# Patient Record
Sex: Female | Born: 1980 | Race: White | Hispanic: No | Marital: Married | Smoking: Never smoker
Health system: Southern US, Community
[De-identification: ages and names within clinical notes are randomized; demographics above are authoritative.]

## PROBLEM LIST (undated history)

## (undated) DIAGNOSIS — J302 Other seasonal allergic rhinitis: Secondary | ICD-10-CM

## (undated) DIAGNOSIS — O24419 Gestational diabetes mellitus in pregnancy, unspecified control: Secondary | ICD-10-CM

## (undated) DIAGNOSIS — K589 Irritable bowel syndrome without diarrhea: Secondary | ICD-10-CM

## (undated) DIAGNOSIS — O139 Gestational [pregnancy-induced] hypertension without significant proteinuria, unspecified trimester: Secondary | ICD-10-CM

## (undated) DIAGNOSIS — F32A Depression, unspecified: Secondary | ICD-10-CM

## (undated) DIAGNOSIS — D259 Leiomyoma of uterus, unspecified: Secondary | ICD-10-CM

## (undated) DIAGNOSIS — F329 Major depressive disorder, single episode, unspecified: Secondary | ICD-10-CM

## (undated) DIAGNOSIS — J189 Pneumonia, unspecified organism: Secondary | ICD-10-CM

## (undated) DIAGNOSIS — F419 Anxiety disorder, unspecified: Secondary | ICD-10-CM

## (undated) DIAGNOSIS — Z8751 Personal history of pre-term labor: Secondary | ICD-10-CM

## (undated) HISTORY — DX: Anxiety disorder, unspecified: F41.9

## (undated) HISTORY — PX: WISDOM TOOTH EXTRACTION: SHX21

---

## 2009-07-20 DIAGNOSIS — E669 Obesity, unspecified: Secondary | ICD-10-CM | POA: Insufficient documentation

## 2009-07-20 DIAGNOSIS — F329 Major depressive disorder, single episode, unspecified: Secondary | ICD-10-CM | POA: Insufficient documentation

## 2012-03-29 ENCOUNTER — Inpatient Hospital Stay: Payer: Self-pay | Admitting: Obstetrics and Gynecology

## 2012-04-02 LAB — PATHOLOGY REPORT

## 2012-07-16 ENCOUNTER — Inpatient Hospital Stay (HOSPITAL_COMMUNITY): Admission: AD | Admit: 2012-07-16 | Payer: Self-pay | Source: Ambulatory Visit | Admitting: Obstetrics and Gynecology

## 2014-10-25 DIAGNOSIS — J189 Pneumonia, unspecified organism: Secondary | ICD-10-CM

## 2014-10-25 HISTORY — DX: Pneumonia, unspecified organism: J18.9

## 2014-11-16 ENCOUNTER — Ambulatory Visit
Admission: RE | Admit: 2014-11-16 | Discharge: 2014-11-16 | Disposition: A | Discharge status: Home / Self Care | Payer: BC Managed Care – PPO | Source: Ambulatory Visit | Attending: Family Medicine | Admitting: Family Medicine

## 2014-11-16 ENCOUNTER — Other Ambulatory Visit: Payer: Self-pay | Admitting: Family Medicine

## 2014-11-16 DIAGNOSIS — R05 Cough: Secondary | ICD-10-CM

## 2014-11-16 DIAGNOSIS — R509 Fever, unspecified: Secondary | ICD-10-CM | POA: Diagnosis present

## 2014-11-16 DIAGNOSIS — R059 Cough, unspecified: Secondary | ICD-10-CM

## 2014-11-16 DIAGNOSIS — J189 Pneumonia, unspecified organism: Secondary | ICD-10-CM | POA: Diagnosis not present

## 2016-02-15 ENCOUNTER — Encounter: Payer: Self-pay | Admitting: Family Medicine

## 2016-02-15 ENCOUNTER — Ambulatory Visit (INDEPENDENT_AMBULATORY_CARE_PROVIDER_SITE_OTHER): Payer: BC Managed Care – PPO | Admitting: Family Medicine

## 2016-02-15 VITALS — BP 122/84 | HR 76 | Temp 98.4°F | Resp 16 | Ht 60.0 in | Wt 231.4 lb

## 2016-02-15 DIAGNOSIS — K589 Irritable bowel syndrome without diarrhea: Secondary | ICD-10-CM

## 2016-02-15 MED ORDER — DICYCLOMINE HCL 20 MG PO TABS: ORAL_TABLET | ORAL | 1 refills | Status: DC

## 2016-02-15 NOTE — Patient Instructions (Signed)
Give the food choices and medication a couple of weeks trial then let me know how you are doing. Do schedule an appointment with [facility] about your skin tag.

## 2016-02-15 NOTE — Progress Notes (Signed)
Subjective:     Patient ID: Amber Whitehead, female   DOB: 1980-07-17, 35 y.o.   MRN: [id number]  HPI  Chief Complaint  Patient presents with  . Skin Problem    Patient comes in office today to address having skin tag removed from right eyelid  . GI Problem    Patient would like to address intermittent episodes of diarrhea, abdominal cramping and gas that has been increasingly worse over the past 6 months.   Skin tag is on the lateral aspect of her right upper eyelid. Discussed seeing her eye doctor at [facility] regarding removal. Reports abdominal symptoms above have been present since childhood though now worse occurring four days/week. States her abdominal symptoms particularly occur when she is going to [employer] or on a trip.She tries to anticipate access to a bathroom before a social event.   Review of Systems  Gastrointestinal: Negative for blood in stool.       Objective:   Physical Exam  Constitutional: She appears well-developed and well-nourished. No distress.  Abdominal: Soft. She exhibits no distension. There is no tenderness.       Assessment:    1. IBS (irritable bowel syndrome) - dicyclomine (BENTYL) 20 MG tablet; One tablet every 6 hours as needed for diarrhea or cramping  Dispense: 28 tablet; Refill: 1    Plan:    Provided with FODMAP foot list. Discussed calling in two weeks regarding her status with treatment. Consider G.I. Referral.

## 2016-04-30 ENCOUNTER — Other Ambulatory Visit: Payer: Self-pay | Admitting: Family Medicine

## 2016-04-30 DIAGNOSIS — K589 Irritable bowel syndrome without diarrhea: Secondary | ICD-10-CM

## 2016-10-25 ENCOUNTER — Ambulatory Visit (INDEPENDENT_AMBULATORY_CARE_PROVIDER_SITE_OTHER): Payer: BC Managed Care – PPO | Admitting: Family Medicine

## 2016-10-25 ENCOUNTER — Encounter: Payer: Self-pay | Admitting: Family Medicine

## 2016-10-25 VITALS — BP 136/98 | HR 121 | Temp 98.8°F | Resp 16 | Wt 234.2 lb

## 2016-10-25 DIAGNOSIS — S46912A Strain of unspecified muscle, fascia and tendon at shoulder and upper arm level, left arm, initial encounter: Secondary | ICD-10-CM

## 2016-10-25 MED ORDER — CELECOXIB 200 MG PO CAPS: 200.0000 mg | ORAL_CAPSULE | Freq: Two times a day (BID) | ORAL | 0 refills | Status: DC

## 2016-10-25 MED ORDER — HYDROCODONE-ACETAMINOPHEN 5-325 MG PO TABS
ORAL_TABLET | ORAL | 0 refills | Status: DC
Start: 2016-10-25 — End: 2018-07-03

## 2016-10-25 NOTE — Patient Instructions (Signed)
Ice your shoulder at the end of your workday for 20 minutes.

## 2016-10-25 NOTE — Progress Notes (Signed)
Subjective:     Patient ID: Amber Whitehead, female   DOB: 1981/04/23, 36 y.o.   MRN: [id number]  HPI  Chief Complaint  Patient presents with  . Shoulder Pain    Patient comes in office today with complaints of left shoulder pain that began yesterday afternoon. Patient states she did do a little heavy lifting monday but does not feel that it caused her to have pain in arm. Patient describes pain as a dull ache that throbs throughout the days. Patient denies pain with overhead activies and denies any previous injury to shoulder. Patient has been taking otc Ibuprofen.   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 States pain has been waking her up at night.Took two  Ibuprofen but it upset her stomach.   Review of Systems     Objective:   Physical Exam  Constitutional: She appears well-developed and well-nourished. No distress.  Musculoskeletal:  Grips/left EF/EE/shoulder: 5/5. Active and passive FROM. Mildly tender to anterior shoulder area.       Assessment:    1. Strain of left shoulder, initial encounter - HYDROcodone-acetaminophen (NORCO/VICODIN) 5-325 MG tablet; One half to one at bedtime as needed for shoulder pain.  Dispense: 10 tablet; Refill: 0 - celecoxib (CELEBREX) 200 MG capsule; Take 1 capsule (200 mg total) by mouth 2 (two) times daily.  Dispense: 28 capsule; Refill: 0    Plan:    Ice her shoulder for 20 minutes after work. Consider x-ray if not improving.

## 2016-10-29 ENCOUNTER — Other Ambulatory Visit: Payer: Self-pay | Admitting: Family Medicine

## 2016-10-29 DIAGNOSIS — K589 Irritable bowel syndrome without diarrhea: Secondary | ICD-10-CM

## 2016-10-31 NOTE — Telephone Encounter (Signed)
Mikki Santee is out of office remainder of week can you please review. Thanks.KW

## 2017-10-18 LAB — HM PAP SMEAR: HM Pap smear: NEGATIVE

## 2018-06-26 DIAGNOSIS — I82401 Acute embolism and thrombosis of unspecified deep veins of right lower extremity: Secondary | ICD-10-CM

## 2018-06-26 HISTORY — DX: Acute embolism and thrombosis of unspecified deep veins of right lower extremity: I82.401

## 2018-07-08 ENCOUNTER — Encounter (HOSPITAL_COMMUNITY): Payer: Self-pay

## 2018-07-08 NOTE — Patient Instructions (Signed)
Your procedure is scheduled on: Friday, Jan. 17, 2020   Surgery Time:  10:00AM-13:59PM   Report to [place]  Entrance    Report to admitting at 8:00 AM   Call this number if you have problems the morning of surgery [phone]   Do not eat food or drink liquids :After Midnight.   Brush your teeth the morning of surgery.   Do NOT smoke after Midnight   Take these medicines the morning of surgery with A SIP OF WATER: None                               You may not have any metal on your body including hair pins, jewelry, and body piercings             Do not wear make-up, lotions, powders, perfumes/cologne, or deodorant             Do not wear nail polish.  Do not shave  48 hours prior to surgery.               Do not bring valuables to the hospital. [place].   Contacts, dentures or bridgework may not be worn into surgery.   Leave suitcase in the car. After surgery it may be brought to your room.    Patients discharged the day of surgery will not be allowed to drive home.   Special Instructions: Bring a copy of your healthcare power of attorney and living will documents         the day of surgery if you haven't scanned them in before.              Please read over the following fact sheets you were given:  [facility] - Preparing for Surgery Before surgery, you can play an important role.  Because skin is not sterile, your skin needs to be as free of germs as possible.  You can reduce the number of germs on your skin by washing with CHG (chlorahexidine gluconate) soap before surgery.  CHG is an antiseptic cleaner which kills germs and bonds with the skin to continue killing germs even after washing. Please DO NOT use if you have an allergy to CHG or antibacterial soaps.  If your skin becomes reddened/irritated stop using the CHG and inform your nurse when you arrive at Short Stay. Do not shave (including  legs and underarms) for at least 48 hours prior to the first CHG shower.  You may shave your face/neck.  Please follow these instructions carefully:  1.  Shower with CHG Soap the night before surgery and the  morning of surgery.  2.  If you choose to wash your hair, wash your hair first as usual with your normal  shampoo.  3.  After you shampoo, rinse your hair and body thoroughly to remove the shampoo.                             4.  Use CHG as you would any other liquid soap.  You can apply chg directly to the skin and wash.  Gently with a scrungie or clean washcloth.  5.  Apply the CHG Soap to your body ONLY FROM THE NECK DOWN.   Do  not use on face/ open                           Wound or open sores. Avoid contact with eyes, ears mouth and   genitals (private parts).                       Wash face,  Genitals (private parts) with your normal soap.             6.  Wash thoroughly, paying special attention to the area where your    surgery  will be performed.  7.  Thoroughly rinse your body with warm water from the neck down.  8.  DO NOT shower/wash with your normal soap after using and rinsing off the CHG Soap.                9.  Pat yourself dry with a clean towel.            10.  Wear clean pajamas.            11.  Place clean sheets on your bed the night of your first shower and do not  sleep with pets. Day of Surgery : Do not apply any lotions/deodorants the morning of surgery.  Please wear clean clothes to the hospital/surgery center.  FAILURE TO FOLLOW THESE INSTRUCTIONS MAY RESULT IN THE CANCELLATION OF YOUR SURGERY  PATIENT SIGNATURE_________________________________  NURSE SIGNATURE__________________________________  ________________________________________________________________________

## 2018-07-10 ENCOUNTER — Other Ambulatory Visit: Payer: Self-pay

## 2018-07-10 ENCOUNTER — Encounter (HOSPITAL_COMMUNITY)
Admission: RE | Admit: 2018-07-10 | Discharge: 2018-07-10 | Disposition: A | Discharge status: Home / Self Care | Payer: BC Managed Care – PPO | Source: Ambulatory Visit | Attending: Obstetrics and Gynecology | Admitting: Obstetrics and Gynecology

## 2018-07-10 ENCOUNTER — Encounter (INDEPENDENT_AMBULATORY_CARE_PROVIDER_SITE_OTHER): Payer: Self-pay

## 2018-07-10 ENCOUNTER — Encounter (HOSPITAL_COMMUNITY): Payer: Self-pay

## 2018-07-10 DIAGNOSIS — D259 Leiomyoma of uterus, unspecified: Secondary | ICD-10-CM | POA: Diagnosis not present

## 2018-07-10 DIAGNOSIS — Z01812 Encounter for preprocedural laboratory examination: Secondary | ICD-10-CM | POA: Insufficient documentation

## 2018-07-10 HISTORY — DX: Depression, unspecified: F32.A

## 2018-07-10 HISTORY — DX: Major depressive disorder, single episode, unspecified: F32.9

## 2018-07-10 HISTORY — DX: Leiomyoma of uterus, unspecified: D25.9

## 2018-07-10 HISTORY — DX: Personal history of pre-term labor: Z87.51

## 2018-07-10 HISTORY — DX: Pneumonia, unspecified organism: J18.9

## 2018-07-10 HISTORY — DX: Other seasonal allergic rhinitis: J30.2

## 2018-07-10 HISTORY — DX: Irritable bowel syndrome, unspecified: K58.9

## 2018-07-10 LAB — CBC
HCT: 40.3 % (ref 36.0–46.0)
Hemoglobin: 12.2 g/dL (ref 12.0–15.0)
MCH: 27.1 pg (ref 26.0–34.0)
MCHC: 30.3 g/dL (ref 30.0–36.0)
MCV: 89.4 fL (ref 80.0–100.0)
Platelets: 332 10*3/uL (ref 150–400)
RBC: 4.51 MIL/uL (ref 3.87–5.11)
RDW: 13.6 % (ref 11.5–15.5)
WBC: 6.2 10*3/uL (ref 4.0–10.5)
nRBC: 0 % (ref 0.0–0.2)

## 2018-07-10 LAB — ABO/RH: ABO/RH(D): B POS

## 2018-07-12 ENCOUNTER — Ambulatory Visit (HOSPITAL_BASED_OUTPATIENT_CLINIC_OR_DEPARTMENT_OTHER): Payer: BC Managed Care – PPO | Admitting: Anesthesiology

## 2018-07-12 ENCOUNTER — Ambulatory Visit (HOSPITAL_BASED_OUTPATIENT_CLINIC_OR_DEPARTMENT_OTHER)
Admission: RE | Admit: 2018-07-12 | Discharge: 2018-07-12 | Disposition: A | Discharge status: Home / Self Care | Payer: BC Managed Care – PPO | Source: Ambulatory Visit | Attending: Obstetrics and Gynecology | Admitting: Obstetrics and Gynecology

## 2018-07-12 ENCOUNTER — Encounter (HOSPITAL_COMMUNITY): Payer: Self-pay

## 2018-07-12 ENCOUNTER — Encounter (HOSPITAL_COMMUNITY)
Admission: RE | Disposition: A | Discharge status: Home / Self Care | Payer: Self-pay | Source: Ambulatory Visit | Attending: Obstetrics and Gynecology

## 2018-07-12 ENCOUNTER — Ambulatory Visit (HOSPITAL_BASED_OUTPATIENT_CLINIC_OR_DEPARTMENT_OTHER): Payer: BC Managed Care – PPO | Admitting: Physician Assistant

## 2018-07-12 DIAGNOSIS — K660 Peritoneal adhesions (postprocedural) (postinfection): Secondary | ICD-10-CM | POA: Insufficient documentation

## 2018-07-12 DIAGNOSIS — J302 Other seasonal allergic rhinitis: Secondary | ICD-10-CM | POA: Diagnosis not present

## 2018-07-12 DIAGNOSIS — Z79899 Other long term (current) drug therapy: Secondary | ICD-10-CM | POA: Diagnosis not present

## 2018-07-12 DIAGNOSIS — F329 Major depressive disorder, single episode, unspecified: Secondary | ICD-10-CM | POA: Diagnosis not present

## 2018-07-12 DIAGNOSIS — N920 Excessive and frequent menstruation with regular cycle: Secondary | ICD-10-CM | POA: Insufficient documentation

## 2018-07-12 DIAGNOSIS — D259 Leiomyoma of uterus, unspecified: Secondary | ICD-10-CM | POA: Diagnosis not present

## 2018-07-12 HISTORY — PX: ROBOT ASSISTED MYOMECTOMY: SHX5142

## 2018-07-12 LAB — TYPE AND SCREEN
ABO/RH(D): B POS
Antibody Screen: NEGATIVE

## 2018-07-12 LAB — PREGNANCY, URINE: Preg Test, Ur: NEGATIVE

## 2018-07-12 SURGERY — MYOMECTOMY, ROBOT-ASSISTED
Anesthesia: General

## 2018-07-12 MED ORDER — ONDANSETRON HCL 4 MG PO TABS: 4.0000 mg | ORAL_TABLET | Freq: Every day | ORAL | 1 refills | Status: DC | PRN

## 2018-07-12 MED ORDER — SODIUM CHLORIDE (PF) 0.9 % IJ SOLN
INTRAMUSCULAR | Status: AC
  Filled 2018-07-12: qty 50

## 2018-07-12 MED ORDER — CEFAZOLIN SODIUM-DEXTROSE 2-4 GM/100ML-% IV SOLN
INTRAVENOUS | Status: AC
  Filled 2018-07-12: qty 100

## 2018-07-12 MED ORDER — MIDAZOLAM HCL 2 MG/2ML IJ SOLN
INTRAMUSCULAR | Status: AC
  Filled 2018-07-12: qty 2

## 2018-07-12 MED ORDER — ONDANSETRON HCL 4 MG/2ML IJ SOLN
INTRAMUSCULAR | Status: AC
  Filled 2018-07-12: qty 2

## 2018-07-12 MED ORDER — FENTANYL CITRATE (PF) 250 MCG/5ML IJ SOLN
INTRAMUSCULAR | Status: AC
  Filled 2018-07-12: qty 5

## 2018-07-12 MED ORDER — PROMETHAZINE HCL 25 MG/ML IJ SOLN: 6.2500 mg | INTRAMUSCULAR | Status: DC | PRN

## 2018-07-12 MED ORDER — LIDOCAINE 2% (20 MG/ML) 5 ML SYRINGE
INTRAMUSCULAR | Status: DC | PRN
  Administered 2018-07-12: 60 mg via INTRAVENOUS

## 2018-07-12 MED ORDER — VASOPRESSIN 20 UNIT/ML IV SOLN
INTRAVENOUS | Status: AC
  Filled 2018-07-12: qty 1

## 2018-07-12 MED ORDER — KETOROLAC TROMETHAMINE 30 MG/ML IJ SOLN
INTRAMUSCULAR | Status: AC
  Filled 2018-07-12: qty 1

## 2018-07-12 MED ORDER — VASOPRESSIN 20 UNIT/ML IV SOLN
INTRAVENOUS | Status: DC | PRN
  Administered 2018-07-12: 60 mL via INTRAMUSCULAR

## 2018-07-12 MED ORDER — SODIUM CHLORIDE 0.9 % IR SOLN
Status: DC | PRN
  Administered 2018-07-12: 1000 mL

## 2018-07-12 MED ORDER — EPHEDRINE 5 MG/ML INJ
INTRAVENOUS | Status: AC
  Filled 2018-07-12: qty 10

## 2018-07-12 MED ORDER — STERILE WATER FOR IRRIGATION IR SOLN
Status: DC | PRN
  Administered 2018-07-12: 60 mL

## 2018-07-12 MED ORDER — FENTANYL CITRATE (PF) 100 MCG/2ML IJ SOLN
INTRAMUSCULAR | Status: DC | PRN
  Administered 2018-07-12 (×2): 25 ug via INTRAVENOUS
  Administered 2018-07-12: 50 ug via INTRAVENOUS
  Administered 2018-07-12: 25 ug via INTRAVENOUS
  Administered 2018-07-12 (×2): 50 ug via INTRAVENOUS
  Administered 2018-07-12 (×3): 25 ug via INTRAVENOUS

## 2018-07-12 MED ORDER — METHYLENE BLUE 0.5 % INJ SOLN
INTRAVENOUS | Status: AC
  Filled 2018-07-12: qty 10

## 2018-07-12 MED ORDER — OXYCODONE-ACETAMINOPHEN 7.5-325 MG PO TABS
1.0000 | ORAL_TABLET | Freq: Once | ORAL | Status: AC
  Administered 2018-07-12: 1 via ORAL
  Filled 2018-07-12: qty 1

## 2018-07-12 MED ORDER — BUPIVACAINE-EPINEPHRINE (PF) 0.5% -1:200000 IJ SOLN
INTRAMUSCULAR | Status: DC | PRN
  Administered 2018-07-12: 11 mL

## 2018-07-12 MED ORDER — DEXAMETHASONE SODIUM PHOSPHATE 10 MG/ML IJ SOLN
INTRAMUSCULAR | Status: DC | PRN
  Administered 2018-07-12 (×2): 5 mg via INTRAVENOUS

## 2018-07-12 MED ORDER — CEFAZOLIN SODIUM-DEXTROSE 2-4 GM/100ML-% IV SOLN
2.0000 g | INTRAVENOUS | Status: AC
  Administered 2018-07-12: 2 g via INTRAVENOUS

## 2018-07-12 MED ORDER — SCOPOLAMINE 1 MG/3DAYS TD PT72
1.0000 | MEDICATED_PATCH | TRANSDERMAL | Status: DC
  Administered 2018-07-12: 1.5 mg via TRANSDERMAL
  Filled 2018-07-12: qty 1

## 2018-07-12 MED ORDER — SUGAMMADEX SODIUM 200 MG/2ML IV SOLN
INTRAVENOUS | Status: AC
  Filled 2018-07-12: qty 2

## 2018-07-12 MED ORDER — ROCURONIUM BROMIDE 10 MG/ML (PF) SYRINGE
PREFILLED_SYRINGE | INTRAVENOUS | Status: DC | PRN
  Administered 2018-07-12: 20 mg via INTRAVENOUS
  Administered 2018-07-12: 10 mg via INTRAVENOUS
  Administered 2018-07-12: 70 mg via INTRAVENOUS
  Administered 2018-07-12 (×2): 10 mg via INTRAVENOUS
  Administered 2018-07-12: 20 mg via INTRAVENOUS

## 2018-07-12 MED ORDER — SUGAMMADEX SODIUM 200 MG/2ML IV SOLN
INTRAVENOUS | Status: DC | PRN
  Administered 2018-07-12: 200 mg via INTRAVENOUS

## 2018-07-12 MED ORDER — KETOROLAC TROMETHAMINE 30 MG/ML IJ SOLN
INTRAMUSCULAR | Status: DC | PRN
  Administered 2018-07-12: 30 mg via INTRAVENOUS

## 2018-07-12 MED ORDER — LACTATED RINGERS IV SOLN
INTRAVENOUS | Status: DC
  Administered 2018-07-12 (×2): via INTRAVENOUS

## 2018-07-12 MED ORDER — BUPIVACAINE-EPINEPHRINE (PF) 0.25% -1:200000 IJ SOLN
INTRAMUSCULAR | Status: AC
  Filled 2018-07-12: qty 30

## 2018-07-12 MED ORDER — ONDANSETRON HCL 4 MG/2ML IJ SOLN
INTRAMUSCULAR | Status: DC | PRN
  Administered 2018-07-12: 4 mg via INTRAVENOUS

## 2018-07-12 MED ORDER — MIDAZOLAM HCL 2 MG/2ML IJ SOLN
INTRAMUSCULAR | Status: DC | PRN
  Administered 2018-07-12: 2 mg via INTRAVENOUS

## 2018-07-12 MED ORDER — DEXAMETHASONE SODIUM PHOSPHATE 10 MG/ML IJ SOLN
INTRAMUSCULAR | Status: AC
  Filled 2018-07-12: qty 1

## 2018-07-12 MED ORDER — LIDOCAINE 2% (20 MG/ML) 5 ML SYRINGE
INTRAMUSCULAR | Status: DC | PRN
  Administered 2018-07-12: 1.5 mg/kg/h via INTRAVENOUS

## 2018-07-12 MED ORDER — LIDOCAINE 2% (20 MG/ML) 5 ML SYRINGE
INTRAMUSCULAR | Status: AC
  Filled 2018-07-12: qty 5

## 2018-07-12 MED ORDER — ROCURONIUM BROMIDE 100 MG/10ML IV SOLN
INTRAVENOUS | Status: AC
  Filled 2018-07-12: qty 1

## 2018-07-12 MED ORDER — FENTANYL CITRATE (PF) 100 MCG/2ML IJ SOLN: 25.0000 ug | INTRAMUSCULAR | Status: DC | PRN

## 2018-07-12 MED ORDER — EPHEDRINE SULFATE-NACL 50-0.9 MG/10ML-% IV SOSY
PREFILLED_SYRINGE | INTRAVENOUS | Status: DC | PRN
  Administered 2018-07-12 (×3): 10 mg via INTRAVENOUS

## 2018-07-12 MED ORDER — PROPOFOL 10 MG/ML IV BOLUS
INTRAVENOUS | Status: DC | PRN
  Administered 2018-07-12: 200 mg via INTRAVENOUS

## 2018-07-12 MED ORDER — ALBUMIN HUMAN 5 % IV SOLN
INTRAVENOUS | Status: DC | PRN
  Administered 2018-07-12: 15:00:00 via INTRAVENOUS

## 2018-07-12 MED ORDER — OXYCODONE-ACETAMINOPHEN 7.5-325 MG PO TABS: 1.0000 | ORAL_TABLET | ORAL | 0 refills | Status: DC | PRN

## 2018-07-12 SURGICAL SUPPLY — 68 items
APPLICATOR ARISTA FLEXITIP XL (MISCELLANEOUS) ×2 IMPLANT
BARRIER ADHS 3X4 INTERCEED (GAUZE/BANDAGES/DRESSINGS) IMPLANT
BENZOIN TINCTURE PRP APPL 2/3 (GAUZE/BANDAGES/DRESSINGS) ×1 IMPLANT
BLADE LAP MORCELLATOR 15X9.5 (ELECTROSURGICAL) ×1 IMPLANT
CATH ROBINSON RED A/P 16FR (CATHETERS) ×1 IMPLANT
CONT PATH 16OZ SNAP LID 3702 (MISCELLANEOUS) ×2 IMPLANT
COVER BACK TABLE 60X90IN (DRAPES) ×4 IMPLANT
COVER TIP SHEARS 8 DVNC (MISCELLANEOUS) ×1 IMPLANT
COVER TIP SHEARS 8MM DA VINCI (MISCELLANEOUS) ×1
DECANTER SPIKE VIAL GLASS SM (MISCELLANEOUS) ×8 IMPLANT
DEFOGGER SCOPE WARMER CLEARIFY (MISCELLANEOUS) ×2 IMPLANT
DEPRESSOR TONGUE BLADE STERILE (MISCELLANEOUS) ×2 IMPLANT
DEVICE TROCAR PUNCTURE CLOSURE (ENDOMECHANICALS) IMPLANT
DRAPE ARM DVNC X/XI (DISPOSABLE) ×4 IMPLANT
DRAPE COLUMN DVNC XI (DISPOSABLE) ×1 IMPLANT
DRAPE DA VINCI XI ARM (DISPOSABLE) ×4
DRAPE DA VINCI XI COLUMN (DISPOSABLE) ×1
DRSG OPSITE POSTOP 3X4 (GAUZE/BANDAGES/DRESSINGS) ×2 IMPLANT
DURAPREP 26ML APPLICATOR (WOUND CARE) ×2 IMPLANT
ELECT REM PT RETURN 9FT ADLT (ELECTROSURGICAL) ×4
ELECTRODE REM PT RTRN 9FT ADLT (ELECTROSURGICAL) ×2 IMPLANT
FILTER SMOKE EVAC LAPAROSHD (FILTER) ×1 IMPLANT
GLOVE BIO SURGEON STRL SZ8 (GLOVE) ×6 IMPLANT
GLOVE BIOGEL PI IND STRL 7.0 (GLOVE) ×1 IMPLANT
GLOVE BIOGEL PI IND STRL 8.5 (GLOVE) ×1 IMPLANT
GLOVE BIOGEL PI INDICATOR 7.0 (GLOVE) ×1
GLOVE BIOGEL PI INDICATOR 8.5 (GLOVE) ×1
HEMOSTAT ARISTA ABSORB 3G PWDR (HEMOSTASIS) ×2 IMPLANT
IRRIG SUCT STRYKERFLOW 2 WTIP (MISCELLANEOUS) ×2
IRRIGATION SUCT STRKRFLW 2 WTP (MISCELLANEOUS) ×1 IMPLANT
LEGGING LITHOTOMY PAIR STRL (DRAPES) ×2 IMPLANT
MANIPULATOR UTERINE 4.5 ZUMI (MISCELLANEOUS) ×2 IMPLANT
MANIPULATOR UTERINE 7CM CLEARV (MISCELLANEOUS) ×1 IMPLANT
NDL SPNL 22GX7 QUINCKE BK (NEEDLE) ×1 IMPLANT
NEEDLE INSUFFLATION 120MM (ENDOMECHANICALS) ×2 IMPLANT
NEEDLE SPNL 22GX7 QUINCKE BK (NEEDLE) ×2 IMPLANT
OBTURATOR OPTICAL STANDARD 8MM (TROCAR) ×1
OBTURATOR OPTICAL STND 8 DVNC (TROCAR) ×1
OBTURATOR OPTICALSTD 8 DVNC (TROCAR) IMPLANT
PACK ROBOT WH (CUSTOM PROCEDURE TRAY) ×2 IMPLANT
PACK ROBOTIC GOWN (GOWN DISPOSABLE) ×2 IMPLANT
PACK TRENDGUARD 450 HYBRID PRO (MISCELLANEOUS) IMPLANT
PAD PREP 24X48 CUFFED NSTRL (MISCELLANEOUS) ×2 IMPLANT
PROTECTOR NERVE ULNAR (MISCELLANEOUS) ×4 IMPLANT
SEAL CANN UNIV 5-8 DVNC XI (MISCELLANEOUS) ×3 IMPLANT
SEAL XI 5MM-8MM UNIVERSAL (MISCELLANEOUS) ×3
SEPRAFILM MEMBRANE 5X6 (MISCELLANEOUS) ×2 IMPLANT
SET CYSTO W/LG BORE CLAMP LF (SET/KITS/TRAYS/PACK) IMPLANT
SET TRI-LUMEN FLTR TB AIRSEAL (TUBING) ×1 IMPLANT
STOPCOCK 4 WAY LG BORE MALE ST (IV SETS) ×1 IMPLANT
STRIP CLOSURE SKIN 1/2X4 (GAUZE/BANDAGES/DRESSINGS) ×1 IMPLANT
SUT DVC VLOC 180 2-0 12IN GS21 (SUTURE) ×2
SUT MNCRL AB 4-0 PS2 18 (SUTURE) ×2 IMPLANT
SUT PROLENE 2 0 KS (SUTURE) ×1 IMPLANT
SUT VIC AB 2-0 CT1 27 (SUTURE)
SUT VIC AB 2-0 CT1 TAPERPNT 27 (SUTURE) IMPLANT
SUT VIC AB 2-0 CT2 27 (SUTURE) ×1 IMPLANT
SUT VICRYL 0 UR6 27IN ABS (SUTURE) ×2 IMPLANT
SUT VLOC 180 2-0 9IN GS21 (SUTURE) ×2 IMPLANT
SUT VLOC 180 3-0 9IN GS21 (SUTURE) ×1 IMPLANT
SUTURE DVC VL 180 2-0 12INGS21 (SUTURE) ×1 IMPLANT
SYR 50ML LL SCALE MARK (SYRINGE) ×2 IMPLANT
SYR TOOMEY 50ML (SYRINGE) ×2 IMPLANT
SYSTEM CARTER THOMASON II (TROCAR) ×1 IMPLANT
TOWEL OR 17X24 6PK STRL BLUE (TOWEL DISPOSABLE) ×4 IMPLANT
TRAY FOLEY W/BAG SLVR 14FR (SET/KITS/TRAYS/PACK) ×2 IMPLANT
TRENDGUARD 450 HYBRID PRO PACK (MISCELLANEOUS) ×2
TROCAR PORT AIRSEAL 5X120 (TROCAR) ×1 IMPLANT

## 2018-07-12 NOTE — H&P (Signed)
Amber Whitehead is a 38 y.o. female , originally referred to me for robotic assisted myomectomy.  She was diagnosed with fibroids because of adverse obstetric history.  She developed breakthrough bleeding and had to stop the pills.  She has been having monthly periods but with heavy flow and prolonged duration.  Patient would like to preserve her childbearing potential.  Pertinent Gynecological History: Menses: flow is excessive with use of 3 pads or tampons on heaviest days Bleeding: dysfunctional uterine bleeding Contraception: none DES exposure: denies Blood transfusions: none Sexually transmitted diseases: no past history  Last pap: normal    Menstrual History: Menarche age: 22 No LMP recorded.    Past Medical History:  Diagnosis Date  . Depression   . History of premature delivery    of twins  . Irritable bowel syndrome (IBS)   . Pneumonia 10/2014  . Seasonal allergies   . Uterine fibroid                     Past Surgical History:  Procedure Laterality Date  . WISDOM TOOTH EXTRACTION               History reviewed. No pertinent family history. No hereditary disease.  No cancer of breast, ovary, uterus. No cutaneous leiomyomatosis or renal cell carcinoma.  Social History   Socioeconomic History  . Marital status: Married    Spouse name: Not on file  . Number of children: Not on file  . Years of education: Not on file  . Highest education level: Not on file  Occupational History  . Not on file  Social Needs  . Financial resource strain: Not on file  . Food insecurity:    Worry: Not on file    Inability: Not on file  . Transportation needs:    Medical: Not on file    Non-medical: Not on file  Tobacco Use  . Smoking status: Never Smoker  . Smokeless tobacco: Never Used  Substance and Sexual Activity  . Alcohol use: No  . Drug use: No  . Sexual activity: Not on file  Lifestyle  . Physical activity:    Days per week: Not on file    Minutes per  session: Not on file  . Stress: Not on file  Relationships  . Social connections:    Talks on phone: Not on file    Gets together: Not on file    Attends religious service: Not on file    Active member of club or organization: Not on file    Attends meetings of clubs or organizations: Not on file    Relationship status: Not on file  . Intimate partner violence:    Fear of current or ex partner: Not on file    Emotionally abused: Not on file    Physically abused: Not on file    Forced sexual activity: Not on file  Other Topics Concern  . Not on file  Social History Narrative  . Not on file    No Known Allergies  No current facility-administered medications on file prior to encounter.    Current Outpatient Medications on File Prior to Encounter  Medication Sig Dispense Refill  . loratadine (CLARITIN) 10 MG tablet Take 10 mg by mouth at bedtime.    . sertraline (ZOLOFT) 50 MG tablet Take 50 mg by mouth at bedtime.        Review of Systems  Constitutional: Negative.   HENT: Negative.   Eyes: Negative.  Respiratory: Negative.   Cardiovascular: Negative.   Gastrointestinal: Negative.   Genitourinary: Negative.   Musculoskeletal: Negative.   Skin: Negative.   Neurological: Negative.   Endo/Heme/Allergies: Negative.   Psychiatric/Behavioral: Negative.      Physical Exam  BP (!) 140/92   Pulse (!) 111   Temp 97.9 F (36.6 C) (Oral)   Ht 5\' 1"  (1.549 m)   Wt 79.8 kg   LMP 07/07/2018 (Exact Date)   SpO2 100%   BMI 33.25 kg/m  Constitutional: She is oriented to person, place, and time. She appears well-developed and well-nourished.  HENT:  Head: Normocephalic and atraumatic.  Nose: Nose normal.  Mouth/Throat: Oropharynx is clear and moist. No oropharyngeal exudate.  Eyes: Conjunctivae normal and EOM are normal. Pupils are equal, round, and reactive to light. No scleral icterus.  Neck: Normal range of motion. Neck supple. No tracheal deviation present. No  thyromegaly present.  Cardiovascular: Normal rate.   Respiratory: Effort normal and breath sounds normal.  GI: Soft. Bowel sounds are normal. She exhibits no distension and no mass. There is no tenderness.  Lymphadenopathy:    She has no cervical adenopathy.  Neurological: She is alert and oriented to person, place, and time. She has normal reflexes.  Skin: Skin is warm.  Psychiatric: She has a normal mood and affect. Her behavior is normal. Judgment and thought content normal.    Assessment/Plan:  Intramural and subserosal uterine myomas, causing menorrhagia and pressure sensation. Preoperative for robot assisted myomectomy Benefits and risks of robotic myomectomy were discussed with the patient and her family member again.  Bowel prep instructions were given.  All of patient's questions were answered.  She verbalized understanding.  She knows that she will need a cesarean delivery for future pregnancies, and that it is recommended she does not conceive for 2-3 months for uterus to heal.

## 2018-07-12 NOTE — Op Note (Signed)
Preoperative diagnosis:  Uterine myomas, menorrhagia   Postoperative diagnosis: Uterine myomas, menorrhagia   Procedure: Laparoscopy, robotic assisted myomectomy, lysis of adhesions, chromotubation  Anesthesia: Gen. endotracheal   Surgeon: Governor Specking   Assistant: Sullivan Lone, RNFA  Complications: None  Estimated blood loss: 200 mL  Specimens: Myoma specimens (8 myomas) Findings: On exam under anesthesia, external genitalia, Bartholin's, Skene's, urethra were normal.  The vagina was normal.  The cervix was patulous and without lesions.  The uterus was palpated at 12-13 gestational week size, firm, irregular and mobile.  There were no adnexal masses palpable.  There was no nodularity in the posterior fornix. On laparoscopy, the liver edge, gallbladder, diaphragm surfaces were normal.  The appendix appeared normal. There were omental adhesions to a 7 x 7 cm left posterior pedunculated myoma which were lysed.  There were adhesions from the sigmoid mesentery to the left infundibulopelvic ligament which were also lysed. There was a 4 x 4 centimeter anterior myoma with surrounding satellite 1 x 1 cm myoma and a posterior 4 x 4 cm myoma with an adjacent 2 x 2 centimeter myoma.  Underneath the posterior myoma there was a degenerating cystic myoma that was also measuring 4 x 4 cm. Both tubes and ovaries appeared normal.  The tubal fimbria were rated at 5 out of 5.  There was patency of both tubes and chromotubation.  There were no peritoneal lesions of endometriosis.    Description of the procedure: Patient was placed in lithotomy position and general endotracheal anesthesia was given.  2 g of cefazolin were given intravenously for prophylaxis. She was prepped and draped in sterile manner. A Foley catheter was inserted into the bladder appeared . A clear view uterine manipulator was inserted into the uterus for uterine manipulation and chromotubation. The uterus sounded to 11 cm.   An operative  field was created on the abdomen and the surgeon was regloved. After preemptive anesthesia with quarter percent bupivacaine and 1:200,000 epinephrine, and intraumbilical skin incision was made. A Verress needle was inserted and pneumoperitoneum was created with carbon dioxide. An 8 mm trocar was placed at this incision. Robotic laparoscope with 3-D camera was inserted and, under direct visualization, 2 left lateral 51mm incisions were made and corresponding robotic trochar and Air seal trocar were placed. On the right side a third robotic trocar was placed. The [employer] XI robot was docked to the patient after placing her in Trendelenburg position. The surgeon continued the rest of the procedure from the surgical console.  A dilute vasopressin solution (0.33 units per milliliter) was injected transcutaneously into the myometrium of the uterus.  Using monopolar coagulating current at tissue effect of 4,  the omental adhesions to the pedunculated myoma were coagulated and then cut.  The base of the myoma was also coagulated and progressively cut. The myoma was thus severed.  Next we made an anterior transverse incision on the myometrium overlying the anterior myomas and 2 of them were removed in this manner.  We then made a posterior horizontal incision the myometrium overlying the posterior myomas and removed the midline myoma as well as the underlying degenerating cystic myoma in small pieces.  The endometrial cavity was not entered.  A total of 8 myomas were removed, ranging in size from 1 cm to 7 cm.  We then extended the right paraumbilical incision to 12 mm in a [employer] morcellator was inserted.  All of the myomas were morcellated and removed.  The myoma defects were closed 3  layers the first and second layers were a 0 V-lock continuous suture and the third layer was a 3-0 V-lock continuous suture. The subserosal myoma defects were closed in 2 layers with 0 and 3-0 V.-lock sutures.   Pelvis was copiously  irrigated with lactated Ringer solution.  Using a laparoscopic applicator a hemostatic powder was sprayed over the oozing on the posterior left uterine serosa and hemostasis was obtained and a slurry of Seprafilm (2 sheets in 40 mL of lactated Ringer solution) was instilled into the pelvis as an adhesion barrier. The umbilical incision and the 12 mm assistant port incisions were closed with 0 Vicryl figure-of-eight sutures. The skin incisions were approximated with 4-0 Monocryl in subcuticular stitches.  At this point the procedure was terminated hemostasis was insured. Instrument and lap pad count was correct.   The patient tolerated the procedure well and was transferred to recovery room in satisfactory condition.  Special Note: Due to to significant myometrial incisions, the patient is recommended to have cesarean delivery with future pregnancies.  Governor Specking, MD

## 2018-07-12 NOTE — Anesthesia Procedure Notes (Signed)
Procedure Name: Intubation Date/Time: 07/12/2018 12:05 PM Performed by: Suan Halter, CRNA Pre-anesthesia Checklist: Patient identified, Emergency Drugs available, Suction available and Patient being monitored Patient Re-evaluated:Patient Re-evaluated prior to induction Oxygen Delivery Method: Circle system utilized Preoxygenation: Pre-oxygenation with 100% oxygen Induction Type: IV induction Ventilation: Mask ventilation without difficulty Laryngoscope Size: Mac and 3 Grade View: Grade I Tube type: Oral Tube size: 7.0 mm Number of attempts: 1 Airway Equipment and Method: Stylet and Oral airway Placement Confirmation: ETT inserted through vocal cords under direct vision,  positive ETCO2 and breath sounds checked- equal and bilateral Secured at: 22 cm Tube secured with: Tape Dental Injury: Teeth and Oropharynx as per pre-operative assessment

## 2018-07-12 NOTE — Discharge Instructions (Addendum)
Myomectomy, Care After This sheet gives you information about how to care for yourself after your procedure. Your health care provider may also give you more specific instructions. If you have problems or questions, contact your health care provider. What can I expect after the procedure? After the procedure, it is common to have:  Pain in your abdomen, especially at the incision areas. You will be given pain medicine to control the pain.  Tiredness. This is a normal part of the recovery process. Your energy level will return to normal over the coming weeks.  Vaginal bleeding. This is normal and will stop in the coming weeks.  Constipation. Recovery time from this procedure will depend on the type of procedure you had and your general overall health prior to the procedure. Follow these instructions at home: Medicines  Take over-the-counter and prescription medicines only as told by your health care provider.  Do not take aspirin because it can cause bleeding.  If you were prescribed an antibiotic medicine, use it as told by your health care provider. Do not stop using the antibiotic even if you start to feel better.  Do not drive or use heavy machinery while taking prescription pain medicine.  Do not drink alcohol while taking prescription pain medicine. Incision care   Follow instructions from your health care provider about how to take care of any incisions. Make sure you: ? Wash your hands with soap and water before you change your bandage (dressing). If soap and water are not available, use hand sanitizer. ? Change your dressing as told by your health care provider. ? Leave adhesive strips in place. These skin closures may need to stay in place for 2 weeks or longer. If adhesive strip edges start to loosen and curl up, you may trim the loose edges. Do not remove adhesive strips completely unless your health care provider tells you to do that.  Check your incision areas every day  for signs of infection. Check for: ? Redness, swelling, or pain. ? Fluid or blood. ? Warmth. ? Pus or a bad smell.  Do not take baths, swim, or use a hot tub until your health care provider approves. You may take a shower after 24hrs. Simply wash as usual and gently pat dry over your incisions/adhesive strips. Do not use lotions over the adhesive strips. Activity  Return to your normal activities as told by your health care provider. Ask your health care provider what activities are safe for you.  Do not do activities that require a lot of effort until your health care provider says it is okay.  Do not lift anything that is heavier than 15 lb (6.8 kg) until your health care provider says that it is safe.  Do not douche, use tampons, or have sexual intercourse until your health care provider approves.  Walk daily but take frequent rest breaks if you tire easily.  Continue to practice deep breathing and coughing. If it hurts to cough, try holding a pillow against your belly as you cough.  Do not drive until your health care provider approves. General instructions  To prevent or treat constipation while you are taking prescription pain medicine, your health care provider may recommend that you: ? Drink enough fluid to keep your urine clear or pale yellow. ? Take over-the-counter or prescription medicines. ? Eat foods that are high in fiber, such as fresh fruits and vegetables, whole grains, and beans. ? Limit foods that are high in fat and processed sugars,  such as fried and sweet foods.  Take your temperature twice a day and write it down. If you develop a fever, this may be a sign that you have an infection.  Do not drink alcohol.  Have someone help you at home for up to 1 week or until you can do your own household activities.  Keep all follow-up visits as instructed by your health care provider. This is important. Contact a health care provider if:  You have a fever.  You  have increasing abdominal pain that is not relieved with medicine.  You have nausea, vomiting, or diarrhea.  You have pain when you urinate or you have blood in your urine.  You have a rash on your body.  You have pain or redness where your IV access tube was inserted.  You have redness, swelling, or pain around an incision.  You have fluid or blood coming from an incision.  An incision feels warm to the touch.  You have pus or a bad smell coming from an incision. Get help right away if:  You have weakness or light-headedness.  You have pain, swelling, or redness in your legs.  You have chest pain.  You faint.  You have shortness of breath.  You have heavy vaginal bleeding.  You have an incision that is opening up. Summary  Recovery time from this procedure will depend on the type of procedure you had and your general overall health prior to the procedure.  If you were prescribed an antibiotic medicine, use it as told by your health care provider. Do not stop using the antibiotic even if you start to feel better.  Do not douche, use tampons, or have sexual intercourse until your health care provider approves.  Return to your normal activities as told by your health care provider. Ask your health care provider what activities are safe for you. This information is not intended to replace advice given to you by your health care provider. Make sure you discuss any questions you have with your health care provider. Document Released: 11/02/2010 Document Revised: 07/13/2016 Document Reviewed: 07/13/2016 Elsevier Interactive Patient Education  2019 [employer].

## 2018-07-12 NOTE — Anesthesia Preprocedure Evaluation (Signed)
Anesthesia Evaluation  Patient identified by MRN, date of birth, ID band Patient awake    Reviewed: Allergy & Precautions, NPO status , Patient's Chart, lab work & pertinent test results  Airway Mallampati: II  TM Distance: >3 FB Neck ROM: Full    Dental no notable dental hx. (+) Dental Advisory Given   Pulmonary neg pulmonary ROS,    Pulmonary exam normal        Cardiovascular negative cardio ROS Normal cardiovascular exam     Neuro/Psych PSYCHIATRIC DISORDERS Depression negative neurological ROS     GI/Hepatic negative GI ROS, Neg liver ROS,   Endo/Other  negative endocrine ROS  Renal/GU negative Renal ROS     Musculoskeletal negative musculoskeletal ROS (+)   Abdominal   Peds negative pediatric ROS (+)  Hematology negative hematology ROS (+)   Anesthesia Other Findings   Reproductive/Obstetrics negative OB ROS                             Anesthesia Physical Anesthesia Plan  ASA: I  Anesthesia Plan: General   Post-op Pain Management:    Induction: Intravenous  PONV Risk Score and Plan: 4 or greater and Ondansetron, Dexamethasone, Scopolamine patch - Pre-op and Diphenhydramine  Airway Management Planned: Oral ETT  Additional Equipment:   Intra-op Plan:   Post-operative Plan: Extubation in OR  Informed Consent: I have reviewed the patients History and Physical, chart, labs and discussed the procedure including the risks, benefits and alternatives for the proposed anesthesia with the patient or authorized representative who has indicated his/her understanding and acceptance.     Dental advisory given  Plan Discussed with: CRNA and Anesthesiologist  Anesthesia Plan Comments:         Anesthesia Quick Evaluation

## 2018-07-12 NOTE — Transfer of Care (Signed)
Immediate Anesthesia Transfer of Care Note  Patient: Amber Whitehead  Procedure(s) Performed: XI ROBOTIC ASSISTED LAPAROSCOPIC MYOMECTOMY (N/A )  Patient Location: PACU  Anesthesia Type:General  Level of Consciousness: drowsy  Airway & Oxygen Therapy: Patient Spontanous Breathing and Patient connected to face mask oxygen  Post-op Assessment: Report given to RN  Post vital signs: Reviewed and stable  Last Vitals: 131/70, 108, 12, 100% Vitals Value Taken Time  BP    Temp    Pulse 109 07/12/2018  4:15 PM  Resp    SpO2 100 % 07/12/2018  4:15 PM  Vitals shown include unvalidated device data.  Last Pain:  Vitals:   07/12/18 0801  TempSrc:   PainSc: 2       Patients Stated Pain Goal: 3 (23/41/44 3601)  Complications: No apparent anesthesia complications

## 2018-07-13 NOTE — Anesthesia Postprocedure Evaluation (Signed)
Anesthesia Post Note  Patient: Amber Whitehead  Procedure(s) Performed: XI ROBOTIC ASSISTED LAPAROSCOPIC MYOMECTOMY (N/A )     Patient location during evaluation: PACU Anesthesia Type: General Level of consciousness: awake and alert Pain management: pain level controlled Vital Signs Assessment: post-procedure vital signs reviewed and stable Respiratory status: spontaneous breathing, nonlabored ventilation, respiratory function stable and patient connected to nasal cannula oxygen Cardiovascular status: blood pressure returned to baseline and stable Postop Assessment: no apparent nausea or vomiting Anesthetic complications: no    Last Vitals:  Vitals:   07/12/18 1645 07/12/18 1908  BP: 126/73 120/80  Pulse: (!) 109 97  Resp: 19 17  Temp:  36.6 C  SpO2: 96% 100%    Last Pain:  Vitals:   07/12/18 1908  TempSrc:   PainSc: 2                  Montez Hageman

## 2018-07-15 ENCOUNTER — Encounter (HOSPITAL_BASED_OUTPATIENT_CLINIC_OR_DEPARTMENT_OTHER): Payer: Self-pay | Admitting: Obstetrics and Gynecology

## 2018-07-17 ENCOUNTER — Telehealth: Payer: Self-pay

## 2018-07-17 ENCOUNTER — Encounter: Payer: Self-pay | Admitting: Family Medicine

## 2018-07-17 ENCOUNTER — Ambulatory Visit: Payer: BC Managed Care – PPO | Admitting: Family Medicine

## 2018-07-17 ENCOUNTER — Ambulatory Visit
Admission: RE | Admit: 2018-07-17 | Discharge: 2018-07-17 | Disposition: A | Discharge status: Home / Self Care | Payer: BC Managed Care – PPO | Source: Ambulatory Visit | Attending: Family Medicine | Admitting: Family Medicine

## 2018-07-17 ENCOUNTER — Other Ambulatory Visit: Payer: Self-pay | Admitting: Family Medicine

## 2018-07-17 VITALS — BP 123/85 | HR 107 | Temp 97.9°F | Wt 181.2 lb

## 2018-07-17 DIAGNOSIS — M79604 Pain in right leg: Secondary | ICD-10-CM

## 2018-07-17 DIAGNOSIS — Z9889 Other specified postprocedural states: Secondary | ICD-10-CM

## 2018-07-17 DIAGNOSIS — M7989 Other specified soft tissue disorders: Secondary | ICD-10-CM | POA: Insufficient documentation

## 2018-07-17 MED ORDER — RIVAROXABAN 20 MG PO TABS: 20.0000 mg | ORAL_TABLET | Freq: Every day | ORAL | 1 refills | Status: DC

## 2018-07-17 NOTE — Patient Instructions (Signed)

## 2018-07-17 NOTE — Telephone Encounter (Signed)
Diane from [facility] called to inform Dr. B that Right lower venous doppler was positive for DVT. Please review over report in chart. Thanks [employer]

## 2018-07-17 NOTE — Progress Notes (Signed)
Patient: Amber Whitehead Female    DOB: [birth year]   38 y.o.   MRN: [id number] Visit Date: 07/17/2018  Today's Provider: Lavon Paganini, MD   Chief Complaint  Patient presents with  . Follow-up   Subjective:    I, Amber Whitehead, CMA, am acting as a scribe for Lavon Paganini, MD.   HPI  Follow-up Patient presents today for a follow-up appointment. Patient had robotic assisted myomectomy on 07/12/2018.  Patient is having pain and swelling of R calf x2-3 days and surgeon wanted patient to follow-up with her primary care provider to make patient does not have a blood clot.  She has been mostly immobile since surgery.  No Known Allergies   Current Outpatient Medications:  .  loratadine (CLARITIN) 10 MG tablet, Take 10 mg by mouth at bedtime., Disp: , Rfl:  .  ondansetron (ZOFRAN) 4 MG tablet, Take 1 tablet (4 mg total) by mouth daily as needed for nausea or vomiting., Disp: 15 tablet, Rfl: 1 .  oxyCODONE-acetaminophen (PERCOCET) 7.5-325 MG tablet, Take 1 tablet by mouth every 4 (four) hours as needed., Disp: 30 tablet, Rfl: 0 .  sertraline (ZOLOFT) 50 MG tablet, Take 50 mg by mouth at bedtime. , Disp: , Rfl:   Review of Systems  Constitutional: Negative.   HENT: Negative.   Respiratory: Negative.   Cardiovascular: Negative.   Musculoskeletal: Negative.   Neurological: Negative.   Psychiatric/Behavioral: Negative.     Social History   Tobacco Use  . Smoking status: Never Smoker  . Smokeless tobacco: Never Used  Substance Use Topics  . Alcohol use: No      Objective:   BP 123/85 (BP Location: Left Arm, Patient Position: Sitting, Cuff Size: Normal)   Pulse (!) 107   Temp 97.9 F (36.6 C) (Oral)   Wt 181 lb 3.2 oz (82.2 kg)   LMP 06/30/2018 (Exact Date)   SpO2 97%   BMI 34.24 kg/m  Vitals:   07/17/18 1321  BP: 123/85  Pulse: (!) 107  Temp: 97.9 F (36.6 C)  TempSrc: Oral  SpO2: 97%  Weight: 181 lb 3.2 oz (82.2 kg)     Physical  Exam Vitals signs reviewed.  Constitutional:      General: She is not in acute distress.    Appearance: Normal appearance.  HENT:     Head: Normocephalic and atraumatic.     Mouth/Throat:     Pharynx: Oropharynx is clear.  Eyes:     General: No scleral icterus.    Conjunctiva/sclera: Conjunctivae normal.  Cardiovascular:     Rate and Rhythm: Normal rate and regular rhythm.     Pulses: Normal pulses.     Heart sounds: Normal heart sounds. No murmur.  Pulmonary:     Effort: Pulmonary effort is normal. No respiratory distress.     Breath sounds: Normal breath sounds. No wheezing or rhonchi.  Musculoskeletal:     Left lower leg: No edema.     Comments: R calf swelling with TTP. + Homans  Skin:    General: Skin is warm and dry.     Capillary Refill: Capillary refill takes less than 2 seconds.     Findings: No rash.  Neurological:     General: No focal deficit present.     Mental Status: She is alert and oriented to person, place, and time.  Psychiatric:        Mood and Affect: Mood normal.  Behavior: Behavior normal.         Assessment & Plan   1. Pain and swelling of lower extremity, right 2. Recent major surgery - unilateral calf swelling and tenderness in the setting of recent surgery with immobility is concerning for possible DVT - will obtain STAT LE doppler to evaluate for DVT - pending imaging results, will plan to start Xarelto (15 mg twice daily with food for 21 days followed by 20 mg once daily) for ~3 months as this is a provoked event - US Venous Img Lower Unilateral Right; Future    Return if symptoms worsen or fail to improve.   The entirety of the information documented in the History of Present Illness, Review of Systems and Physical Exam were personally obtained by me. Portions of this information were initially documented by [facility], CMA and reviewed by me for thoroughness and accuracy.    Virginia Crews, MD, MPH [facility] 07/17/2018 1:42 PM

## 2018-07-17 NOTE — Telephone Encounter (Signed)
Spoke with [facility] radiology department and patient was advised. Also Dr.B is aware of the results as well.

## 2018-08-12 ENCOUNTER — Other Ambulatory Visit: Payer: Self-pay | Admitting: Family Medicine

## 2018-08-12 NOTE — Telephone Encounter (Signed)
See Rx request ° °

## 2018-08-20 ENCOUNTER — Encounter: Payer: Self-pay | Admitting: Family Medicine

## 2018-08-20 ENCOUNTER — Ambulatory Visit (INDEPENDENT_AMBULATORY_CARE_PROVIDER_SITE_OTHER): Payer: BC Managed Care – PPO | Admitting: Family Medicine

## 2018-08-20 VITALS — BP 112/82 | HR 66 | Temp 97.6°F | Resp 15 | Wt 176.2 lb

## 2018-08-20 DIAGNOSIS — Z86718 Personal history of other venous thrombosis and embolism: Secondary | ICD-10-CM

## 2018-08-20 DIAGNOSIS — Z09 Encounter for follow-up examination after completed treatment for conditions other than malignant neoplasm: Secondary | ICD-10-CM

## 2018-08-20 NOTE — Progress Notes (Signed)
  Subjective:     Patient ID: Amber Whitehead, female   DOB: 1981/01/22, 38 y.o.   MRN: [id number] Chief Complaint  Patient presents with  . Follow-up    Patient returns to office today for follow up from 07/17/17, at last visit patient complained of pain and swelling in her lower right extremity. Ultrasound ordered on 07/17/18 was positive for DVT. Patient reports today that pain is greatly improved and states that all of her symptoms seem to have resolved. Patient states only question she has today is how long will she have to continue on Xarelto?    HPI Denies swelling and started on the maintenance dose of Xarelto one week ago.  Review of Systems     Objective:   Physical Exam Constitutional:      General: She is not in acute distress. Musculoskeletal:     Right lower leg: No edema.  Neurological:     Mental Status: She is alert.        Assessment:    1. Encounter for follow-up of acute deep vein thrombosis (DVT) of right lower extremity: continue Xarelto x 3 months    Plan:    F/u with Dr. B or call prior to completing medication.

## 2018-08-20 NOTE — Patient Instructions (Signed)
Follow up with Dr. B in 11 weeks to finalize the decision about stopping medication.

## 2018-11-06 ENCOUNTER — Ambulatory Visit (INDEPENDENT_AMBULATORY_CARE_PROVIDER_SITE_OTHER): Payer: BC Managed Care – PPO | Admitting: Family Medicine

## 2018-11-06 ENCOUNTER — Encounter: Payer: Self-pay | Admitting: Family Medicine

## 2018-11-06 DIAGNOSIS — N920 Excessive and frequent menstruation with regular cycle: Secondary | ICD-10-CM | POA: Insufficient documentation

## 2018-11-06 DIAGNOSIS — Z86718 Personal history of other venous thrombosis and embolism: Secondary | ICD-10-CM | POA: Insufficient documentation

## 2018-11-06 DIAGNOSIS — F330 Major depressive disorder, recurrent, mild: Secondary | ICD-10-CM

## 2018-11-06 DIAGNOSIS — D259 Leiomyoma of uterus, unspecified: Secondary | ICD-10-CM | POA: Insufficient documentation

## 2018-11-06 DIAGNOSIS — R1909 Other intra-abdominal and pelvic swelling, mass and lump: Secondary | ICD-10-CM | POA: Insufficient documentation

## 2018-11-06 NOTE — Progress Notes (Signed)
Patient: Amber Whitehead Female    DOB: 1981/03/16   38 y.o.   MRN: [id number] Visit Date: 11/06/2018  Today's Provider: Lavon Paganini, MD   Chief Complaint  Patient presents with  . DVT  . Depression   Subjective:    Virtual Visit via Video Note  I connected with Amber Whitehead on 11/06/18 at 10:40 AM EDT by a video enabled telemedicine application and verified that I am speaking with the correct person using two identifiers.   Patient location: home Provider location: [place] involved in the visit: patient, provider   I discussed the limitations of evaluation and management by telemedicine and the availability of in person appointments. The patient expressed understanding and agreed to proceed.   HPI Depression:  Patient presents for a follow up. Patient is currently taking Zoloft 50 mg daily. Patient reports good compliance with treatment plan. She states symptoms are stable.   Depression screen PHQ 2/9 11/06/2018  Decreased Interest 1  Down, Depressed, Hopeless 1  PHQ - 2 Score 2  Altered sleeping 2  Tired, decreased energy 1  Change in appetite 1  Feeling bad or failure about yourself  1  Trouble concentrating 0  Moving slowly or fidgety/restless 0  Suicidal thoughts 0  PHQ-9 Score 7  Difficult doing work/chores Not difficult at all    GAD 7 : Generalized Anxiety Score 11/06/2018  Nervous, Anxious, on Edge 1  Control/stop worrying 1  Worry too much - different things 1  Trouble relaxing 1  Restless 0  Easily annoyed or irritable 1  Afraid - awful might happen 0  Total GAD 7 Score 5  Anxiety Difficulty Somewhat difficult    Patient was diagnosed with right lower extremity DVT on 07/17/2018.  This is in the setting of recent abdominal surgery.  She took Xarelto for 3 months.  She has self discontinued this after the 3 months.  She denies any recurrent symptoms of calf swelling or tenderness  On ultrasound looking  for DVT in 06/2018, she was also found to have a fluid collection in her right groin that was consistent with possible seroma or hematoma.  This was thought to be related to the recent surgery she had had.  No Known Allergies   Current Outpatient Medications:  .  loratadine (CLARITIN) 10 MG tablet, Take 10 mg by mouth at bedtime., Disp: , Rfl:  .  sertraline (ZOLOFT) 50 MG tablet, Take 50 mg by mouth at bedtime. , Disp: , Rfl:  .  XARELTO 20 MG TABS tablet, TAKE 1 TABLET (20 MG TOTAL) BY MOUTH DAILY WITH SUPPER. (Patient not taking: Reported on 11/06/2018), Disp: 30 tablet, Rfl: 2  Review of Systems  Constitutional: Negative.   Respiratory: Negative.   Cardiovascular: Negative.   Musculoskeletal: Negative.     Social History   Tobacco Use  . Smoking status: Never Smoker  . Smokeless tobacco: Never Used  Substance Use Topics  . Alcohol use: No      Objective:   There were no vitals taken for this visit. There were no vitals filed for this visit.   Physical Exam Constitutional:      Appearance: Normal appearance.  Pulmonary:     Effort: Pulmonary effort is normal. No respiratory distress.  Neurological:     Mental Status: She is alert and oriented to person, place, and time. Mental status is at baseline.  Psychiatric:        Mood and  Affect: Mood normal.        Behavior: Behavior normal.         Assessment & Plan    I discussed the assessment and treatment plan with the patient. The patient was provided an opportunity to ask questions and all were answered. The patient agreed with the plan and demonstrated an understanding of the instructions.   The patient was advised to call back or seek an in-person evaluation if the symptoms worsen or if the condition fails to improve as anticipated.  Problem List Items Addressed This Visit      Other   MDD (major depressive disorder) - Primary    Chronic and well-controlled Continue Zoloft 50 mg nightly Discussed importance  of therapy Contracted for safety Discussed return precautions      Right groin mass    Found incidentally on imaging for DVT Likely a seroma or hematoma related to her recent surgery As it has been about 4 months, we will repeat ultrasound to evaluate this and ensure it has resolved If not, consider surgery referral      Relevant Orders   US SOFT TISSUE LOWER EXTREMITY LIMITED RIGHT (NON-VASCULAR)   History of deep vein thrombosis (DVT) of lower extremity    History of provoked DVT Completed 65-month course of Xarelto No recurrent symptoms Discussed return precautions and symptoms to watch out for          Return in about 4 months (around 03/09/2019) for CPE.   The entirety of the information documented in the History of Present Illness, Review of Systems and Physical Exam were personally obtained by me. Portions of this information were initially documented by Tiburcio Pea, CMA and reviewed by me for thoroughness and accuracy.    Virginia Crews, MD, MPH [facility] 11/07/2018 3:51 PM

## 2018-11-07 NOTE — Assessment & Plan Note (Signed)
Chronic and well-controlled Continue Zoloft 50 mg nightly Discussed importance of therapy Contracted for safety Discussed return precautions

## 2018-11-07 NOTE — Assessment & Plan Note (Signed)
History of provoked DVT Completed 1-month course of Xarelto No recurrent symptoms Discussed return precautions and symptoms to watch out for

## 2018-11-07 NOTE — Assessment & Plan Note (Signed)
Found incidentally on imaging for DVT Likely a seroma or hematoma related to her recent surgery As it has been about 4 months, we will repeat ultrasound to evaluate this and ensure it has resolved If not, consider surgery referral

## 2018-11-14 ENCOUNTER — Other Ambulatory Visit: Payer: Self-pay

## 2018-11-14 ENCOUNTER — Ambulatory Visit
Admission: RE | Admit: 2018-11-14 | Discharge: 2018-11-14 | Disposition: A | Discharge status: Home / Self Care | Payer: BC Managed Care – PPO | Source: Ambulatory Visit | Attending: Family Medicine | Admitting: Family Medicine

## 2018-11-14 ENCOUNTER — Telehealth: Payer: Self-pay

## 2018-11-14 DIAGNOSIS — R1909 Other intra-abdominal and pelvic swelling, mass and lump: Secondary | ICD-10-CM | POA: Diagnosis present

## 2018-11-14 NOTE — Telephone Encounter (Signed)
-----   Message from Virginia Crews, MD sent at 11/14/2018  3:40 PM EDT ----- There is still a mass in the R groin that seems to have increased slightly in size.  I recommend a referral to surgery to discuss biopsy and further management.  OK to place referral to any surgeon that she prefers.  Typically we just use [facility] downstairs if she doesn't have a preference. Thanks!

## 2018-11-14 NOTE — Telephone Encounter (Signed)
Patient advised. Referral placed  

## 2018-12-04 ENCOUNTER — Ambulatory Visit: Payer: BC Managed Care – PPO | Admitting: General Surgery

## 2018-12-04 ENCOUNTER — Encounter: Payer: Self-pay | Admitting: General Surgery

## 2018-12-04 ENCOUNTER — Other Ambulatory Visit: Payer: Self-pay

## 2018-12-04 VITALS — BP 135/83 | HR 87 | Temp 97.5°F | Resp 16 | Ht 60.0 in | Wt 190.6 lb

## 2018-12-04 DIAGNOSIS — R1907 Generalized intra-abdominal and pelvic swelling, mass and lump: Secondary | ICD-10-CM | POA: Diagnosis not present

## 2018-12-04 DIAGNOSIS — R1909 Other intra-abdominal and pelvic swelling, mass and lump: Secondary | ICD-10-CM

## 2018-12-04 NOTE — Progress Notes (Signed)
Patient ID: Amber Whitehead, female   DOB: 10/27/1980, 38 y.o.   MRN: [id number]  Chief Complaint  Patient presents with  . New Patient (Initial Visit)    new pt ref Dr.Bacigalupo right groin mass    HPI Amber Whitehead is a 38 y.o. female.  She has been referred by Dr. Brita Romp for further evaluation of a right groin mass.  Ms. Luhmann underwent a robot-assisted myomectomy in January of this year.  She developed severe pain in her right leg, which led to an ultrasound.  This revealed a DVT, but also a mass in her right groin.  She was on a blood thinner for 3 months.  She had a repeat groin ultrasound that showed an increase in the size of the mass.  She is here today for further evaluation.  She states that she would not know the mass was there, had it not been discovered on ultrasound.  She denies any pain in the area.  No numbness in her leg.  No interference with walking or other motion.   Past Medical History:  Diagnosis Date  . Depression   . History of premature delivery    of twins  . Irritable bowel syndrome (IBS)   . Pneumonia 10/2014  . Seasonal allergies   . Uterine fibroid     Past Surgical History:  Procedure Laterality Date  . ROBOT ASSISTED MYOMECTOMY N/A 07/12/2018   Procedure: XI ROBOTIC ASSISTED LAPAROSCOPIC MYOMECTOMY;  Surgeon: Governor Specking, MD;  Location: [facility];  Service: Gynecology;  Laterality: N/A;  . WISDOM TOOTH EXTRACTION      Family History  Problem Relation Age of Onset  . Breast cancer Neg Hx   . Cancer - Colon Neg Hx     Social History Social History   Tobacco Use  . Smoking status: Never Smoker  . Smokeless tobacco: Never Used  Substance Use Topics  . Alcohol use: No  . Drug use: No    No Known Allergies  Current Outpatient Medications  Medication Sig Dispense Refill  . loratadine (CLARITIN) 10 MG tablet Take 10 mg by mouth at bedtime.    . sertraline (ZOLOFT) 50 MG tablet Take 50 mg by mouth at  bedtime.      No current facility-administered medications for this visit.     Review of Systems Review of Systems  All other systems reviewed and are negative.   Blood pressure 135/83, pulse 87, temperature (!) 97.5 F (36.4 C), temperature source Temporal, resp. rate 16, height 5' (1.524 m), weight 190 lb 9.6 oz (86.5 kg), SpO2 99 %.  Physical Exam Physical Exam Vitals signs reviewed. Exam conducted with a chaperone present.  Constitutional:      General: She is not in acute distress.    Appearance: She is obese.  HENT:     Head: Normocephalic and atraumatic.     Nose:     Comments: Covered with a mask, secondary to COVID-19 precautions    Mouth/Throat:     Comments: Covered with a mask, secondary to COVID-19 precautions Eyes:     General: No scleral icterus.       Right eye: No discharge.        Left eye: No discharge.     Pupils: Pupils are equal, round, and reactive to light.     Comments: Skin tag on right eyelid  Neck:     Musculoskeletal: Normal range of motion. No neck rigidity.     Comments: No  thyromegaly, trachea is midline. Cardiovascular:     Rate and Rhythm: Normal rate and regular rhythm.     Pulses: Normal pulses.     Heart sounds: No murmur.  Pulmonary:     Effort: Pulmonary effort is normal.     Breath sounds: Normal breath sounds.  Abdominal:     General: Bowel sounds are normal.     Palpations: Abdomen is soft.  Genitourinary:    Comments: Deferred Musculoskeletal:        General: No swelling or tenderness.     Comments: I am unable to palpate the mass described on ultrasound.  Lymphadenopathy:     Cervical: No cervical adenopathy.  Skin:    General: Skin is warm and dry.  Neurological:     General: No focal deficit present.     Mental Status: She is alert and oriented to person, place, and time.  Psychiatric:        Mood and Affect: Mood normal.        Behavior: Behavior normal.     Data Reviewed I personally reviewed the ultrasound  images obtained.  It is not clear from the description given by the radiologist exactly where in the groin the mass is located.  I do appreciate the cystic and solid nature of the findings along with the increased intrinsic blood flow.  Assessment Eugenia Eldredge is a 38 year old woman who had a DVT after myomectomy.  Ultrasound imaging identified a right groin mass.  It has grown in the interval since the initial scan.  Unfortunately, I am unable to palpate it on exam today.  Certainly the growth and increased vascular flow raised the concern for a possible neoplastic process.  Plan We will obtain a CT scan of the abdomen and pelvis with contrast.  Hopefully this will better delineate the location and margins of the mass in question.  She may require a biopsy.  I will await these results and determine what course of action is most appropriate pending the CT findings.    Fredirick Maudlin 12/04/2018, 2:13 PM

## 2018-12-04 NOTE — Patient Instructions (Addendum)
The patient is aware to call back for any questions or new concerns. Schedule CT abdomen and pelvis for 12-10-18 at 1:30, aware nothing to eat or drink 4 hours before test and to pick up prep kit.

## 2018-12-10 ENCOUNTER — Other Ambulatory Visit: Payer: Self-pay

## 2018-12-10 ENCOUNTER — Ambulatory Visit
Admission: RE | Admit: 2018-12-10 | Discharge: 2018-12-10 | Disposition: A | Discharge status: Home / Self Care | Payer: BC Managed Care – PPO | Source: Ambulatory Visit | Attending: General Surgery | Admitting: General Surgery

## 2018-12-10 DIAGNOSIS — R1907 Generalized intra-abdominal and pelvic swelling, mass and lump: Secondary | ICD-10-CM | POA: Insufficient documentation

## 2018-12-10 MED ORDER — IOHEXOL 300 MG/ML  SOLN
100.0000 mL | Freq: Once | INTRAMUSCULAR | Status: AC | PRN
  Administered 2018-12-10: 100 mL via INTRAVENOUS

## 2018-12-16 ENCOUNTER — Telehealth: Payer: Self-pay | Admitting: *Deleted

## 2018-12-16 NOTE — Telephone Encounter (Signed)
Patient called the office wanting CT scan results from 12-10-18. Patient states she has not heard anything from our office.  Message routed to Dr. Celine Ahr.

## 2018-12-17 NOTE — Telephone Encounter (Signed)
Discussed CT findings w/pt.  Will schedule for surgery.

## 2018-12-18 ENCOUNTER — Other Ambulatory Visit: Payer: Self-pay | Admitting: General Surgery

## 2018-12-20 ENCOUNTER — Telehealth: Payer: Self-pay | Admitting: General Surgery

## 2018-12-20 NOTE — Telephone Encounter (Signed)
I spoke with the patient and discussed with her that I had reviewed her case with several of my colleagues.  The consensus was that it might be best to perform a biopsy of the mass before proceeding to surgery, as the results may dictate what the best course of action might be.  I will arrange that testing and contact her when I have results

## 2018-12-24 ENCOUNTER — Telehealth: Payer: Self-pay | Admitting: *Deleted

## 2018-12-24 NOTE — Telephone Encounter (Signed)
-----   Message from Fredirick Maudlin, MD sent at 12/20/2018  3:05 PM EDT ----- Regarding: biopsy groin mass Can we get Ms. Vangilder scheduled for an image-guided biopsy of the mass in her right groin?  Either ultrasound or CT guided, but we probably need a core or Tru-cut biopsy.  Thanks! --Lavella Hammock

## 2018-12-24 NOTE — Telephone Encounter (Signed)
I did speak with Marcie Bal in Special Scheduling [phone]) in regards to getting biopsy scheduled.   She will have the I.R. MD review and call our office back.

## 2018-12-26 ENCOUNTER — Other Ambulatory Visit: Payer: Self-pay | Admitting: *Deleted

## 2018-12-26 DIAGNOSIS — R1909 Other intra-abdominal and pelvic swelling, mass and lump: Secondary | ICD-10-CM

## 2018-12-31 ENCOUNTER — Other Ambulatory Visit: Payer: Self-pay | Admitting: General Surgery

## 2018-12-31 ENCOUNTER — Other Ambulatory Visit: Payer: Self-pay | Admitting: Student

## 2018-12-31 DIAGNOSIS — R1909 Other intra-abdominal and pelvic swelling, mass and lump: Secondary | ICD-10-CM

## 2018-12-31 NOTE — Telephone Encounter (Signed)
Patient has been scheduled for her ultrasound guided biopsy for 01-01-19 at 9 am (arrive 8 am). She has been notified of this per [facility].

## 2019-01-01 ENCOUNTER — Other Ambulatory Visit: Payer: Self-pay

## 2019-01-01 ENCOUNTER — Ambulatory Visit: Payer: BC Managed Care – PPO

## 2019-01-01 ENCOUNTER — Ambulatory Visit
Admission: RE | Admit: 2019-01-01 | Discharge: 2019-01-01 | Disposition: A | Discharge status: Home / Self Care | Payer: BC Managed Care – PPO | Source: Ambulatory Visit | Attending: General Surgery | Admitting: General Surgery

## 2019-01-01 DIAGNOSIS — K589 Irritable bowel syndrome without diarrhea: Secondary | ICD-10-CM | POA: Insufficient documentation

## 2019-01-01 DIAGNOSIS — Z79899 Other long term (current) drug therapy: Secondary | ICD-10-CM | POA: Insufficient documentation

## 2019-01-01 DIAGNOSIS — Z86718 Personal history of other venous thrombosis and embolism: Secondary | ICD-10-CM | POA: Insufficient documentation

## 2019-01-01 DIAGNOSIS — R1909 Other intra-abdominal and pelvic swelling, mass and lump: Secondary | ICD-10-CM | POA: Insufficient documentation

## 2019-01-01 DIAGNOSIS — F329 Major depressive disorder, single episode, unspecified: Secondary | ICD-10-CM | POA: Diagnosis not present

## 2019-01-01 LAB — CBC
HCT: 39.7 % (ref 36.0–46.0)
Hemoglobin: 11.9 g/dL — ABNORMAL LOW (ref 12.0–15.0)
MCH: 25.5 pg — ABNORMAL LOW (ref 26.0–34.0)
MCHC: 30 g/dL (ref 30.0–36.0)
MCV: 85.2 fL (ref 80.0–100.0)
Platelets: 330 10*3/uL (ref 150–400)
RBC: 4.66 MIL/uL (ref 3.87–5.11)
RDW: 16 % — ABNORMAL HIGH (ref 11.5–15.5)
WBC: 5.5 10*3/uL (ref 4.0–10.5)
nRBC: 0 % (ref 0.0–0.2)

## 2019-01-01 LAB — PROTIME-INR
INR: 1 (ref 0.8–1.2)
Prothrombin Time: 12.9 seconds (ref 11.4–15.2)

## 2019-01-01 MED ORDER — MIDAZOLAM HCL 2 MG/2ML IJ SOLN
INTRAMUSCULAR | Status: AC
  Filled 2019-01-01: qty 4

## 2019-01-01 MED ORDER — MIDAZOLAM HCL 2 MG/2ML IJ SOLN
INTRAMUSCULAR | Status: AC | PRN
  Administered 2019-01-01: 1 mg via INTRAVENOUS

## 2019-01-01 MED ORDER — FENTANYL CITRATE (PF) 100 MCG/2ML IJ SOLN
INTRAMUSCULAR | Status: AC
  Filled 2019-01-01: qty 2

## 2019-01-01 MED ORDER — SODIUM CHLORIDE 0.9 % IV SOLN
INTRAVENOUS | Status: DC
  Administered 2019-01-01: 09:00:00 via INTRAVENOUS

## 2019-01-01 MED ORDER — FENTANYL CITRATE (PF) 100 MCG/2ML IJ SOLN
INTRAMUSCULAR | Status: AC | PRN
  Administered 2019-01-01: 50 ug via INTRAVENOUS

## 2019-01-01 NOTE — Consult Note (Signed)
Chief Complaint: Indeterminate right inguinal collection/mass   Referring Physician(s): Cannon,Jennifer  Patient Status: [facility] - Out-pt  History of Present Illness: Amber Whitehead is a 38 y.o. female with past medical history significant for uterine fibroids and DVT who was found to have a indeterminate partially solid though predominantly cystic collection within the right groin on lower extremity venous Doppler ultrasound performed 07/17/2018.  The collection/lesion was noted to persist on soft tissue inguinal ultrasound performed 11/14/2018 and remained indeterminate on contrast-enhanced CT scan performed 12/10/2018.  As such, patient presents today for ultrasound-guided aspiration and/or biopsy.  The patient remains asymptomatic in regards to this incidentally discovered cystic lesion.  Patient is otherwise without complaint.  Specifically, no chest pain, shortness of breath, fever or chills.  Past Medical History:  Diagnosis Date   Depression    History of premature delivery    of twins   Irritable bowel syndrome (IBS)    Pneumonia 10/2014   Seasonal allergies    Uterine fibroid     Past Surgical History:  Procedure Laterality Date   ROBOT ASSISTED MYOMECTOMY N/A 07/12/2018   Procedure: XI ROBOTIC ASSISTED LAPAROSCOPIC MYOMECTOMY;  Surgeon: Governor Specking, MD;  Location: [facility];  Service: Gynecology;  Laterality: N/A;   WISDOM TOOTH EXTRACTION      Allergies: Patient has no known allergies.  Medications: Prior to Admission medications   Medication Sig Start Date End Date Taking? Authorizing Provider  loratadine (CLARITIN) 10 MG tablet Take 10 mg by mouth at bedtime.   Yes [provider]  sertraline (ZOLOFT) 50 MG tablet Take 50 mg by mouth at bedtime.  02/04/16  Yes [provider]     Family History  Problem Relation Age of Onset   Breast cancer Neg Hx    Cancer - Colon Neg Hx     Social History    Socioeconomic History   Marital status: Married    Spouse name: Not on file   Number of children: Not on file   Years of education: Not on file   Highest education level: Not on file  Occupational History   Not on file  Social Needs   Financial resource strain: Not on file   Food insecurity    Worry: Not on file    Inability: Not on file   Transportation needs    Medical: Not on file    Non-medical: Not on file  Tobacco Use   Smoking status: Never Smoker   Smokeless tobacco: Never Used  Substance and Sexual Activity   Alcohol use: No   Drug use: No   Sexual activity: Not on file  Lifestyle   Physical activity    Days per week: Not on file    Minutes per session: Not on file   Stress: Not on file  Relationships   Social connections    Talks on phone: Not on file    Gets together: Not on file    Attends religious service: Not on file    Active member of club or organization: Not on file    Attends meetings of clubs or organizations: Not on file    Relationship status: Not on file  Other Topics Concern   Not on file  Social History Narrative   Not on file    ECOG Status: 0 - Asymptomatic  Review of Systems: A 12 point ROS discussed and pertinent positives are indicated in the HPI above.  All other systems are negative.  Review of  Systems  Constitutional: Negative for activity change, fever and unexpected weight change.  Respiratory: Negative.   Cardiovascular: Negative.   Gastrointestinal: Negative.     Vital Signs: BP 139/81    Pulse (!) 102    Temp 98.3 F (36.8 C) (Oral)    Ht 5' (1.524 m)    Wt 88.5 kg    SpO2 100%    BMI 38.08 kg/m   Physical Exam Vitals signs and nursing note reviewed. Exam conducted with a chaperone present.  Constitutional:      Appearance: Normal appearance. She is obese.  HENT:     Head: Normocephalic and atraumatic.  Cardiovascular:     Pulses: Normal pulses.     Heart sounds: Normal heart sounds.   Pulmonary:     Effort: Pulmonary effort is normal.     Breath sounds: Normal breath sounds.  Neurological:     Mental Status: She is alert.  Psychiatric:        Mood and Affect: Mood normal.        Behavior: Behavior normal.        Thought Content: Thought content normal.     Imaging: Ct Abdomen Pelvis W Contrast  Result Date: 12/11/2018 CLINICAL DATA:  Follow-up soft tissue ultrasound EXAM: CT ABDOMEN AND PELVIS WITH CONTRAST TECHNIQUE: Multidetector CT imaging of the abdomen and pelvis was performed using the standard protocol following bolus administration of intravenous contrast. CONTRAST:  128mL OMNIPAQUE IOHEXOL 300 MG/ML SOLN, additional oral enteric contrast COMPARISON:  Soft tissue ultrasound, 11/14/2018 FINDINGS: Lower chest: No acute abnormality. Hepatobiliary: No solid liver abnormality is seen. No gallstones, gallbladder wall thickening, or biliary dilatation. Pancreas: Unremarkable. No pancreatic ductal dilatation or surrounding inflammatory changes. Spleen: Normal in size without significant abnormality. Adrenals/Urinary Tract: There is a nonspecific soft tissue attenuation nodule of the left adrenal gland measuring 1.9 cm. Kidneys are normal, without renal calculi, solid lesion, or hydronephrosis. Bladder is unremarkable. Stomach/Bowel: Stomach is within normal limits. Appendix appears normal. No evidence of bowel wall thickening, distention, or inflammatory changes. Vascular/Lymphatic: No significant vascular findings are present. No enlarged abdominal or pelvic lymph nodes. Reproductive: Fibroid uterus. Other: There is a soft tissue attenuation mass measuring approximately 8.1 x 6.7 x 3.6 cm contained within a right femoral hernia (series 2, image 78, series 6, image 52). No abdominopelvic ascites. Musculoskeletal: No acute or significant osseous findings. IMPRESSION: 1. There is a soft tissue attenuation mass measuring approximately 8.1 x 6.7 x 3.6 cm contained within a right  femoral hernia (series 2, image 78, series 6, image 52). This is in keeping with prior ultrasound and concerning for malignancy. 2. There is a nonspecific soft tissue attenuation nodule of the left adrenal gland measuring 1.9 cm. In the absence of an established malignancy, recommend follow-up adrenal protocol CT or MRI at 1 year to characterize and establish stability. Otherwise attention on follow-up. 3. No other findings in the abdomen or pelvis to suggest metastatic disease. No lymphadenopathy. 4.  Fibroid uterus. Electronically Signed   By: Eddie Candle M.D.   On: 12/11/2018 09:29    Labs:  CBC: Recent Labs    07/10/18 0928 01/01/19 0821  WBC 6.2 5.5  HGB 12.2 11.9*  HCT 40.3 39.7  PLT 332 330    COAGS: No results for input(s): INR, APTT in the last 8760 hours.  BMP: No results for input(s): NA, K, CL, CO2, GLUCOSE, BUN, CALCIUM, CREATININE, GFRNONAA, GFRAA in the last 8760 hours.  Invalid input(s): CMP  LIVER FUNCTION  TESTS: No results for input(s): BILITOT, AST, ALT, ALKPHOS, PROT, ALBUMIN in the last 8760 hours.  TUMOR MARKERS: No results for input(s): AFPTM, CEA, CA199, CHROMGRNA in the last 8760 hours.  Assessment and Plan:  Amber Whitehead is a 38 y.o. female with past medical history significant for uterine fibroids and DVT who was found to have an indeterminate partially solid though predominantly cystic collection within the right groin.  The patient remains asymptomatic in regards to this incidentally discovered cystic lesion.  Risks and benefits of US guided right inguinal partially solid though prominently cystic lesion biopsy was discussed with the patient and/or patient's family including, but not limited to bleeding, infection, damage to adjacent structures or low yield requiring additional tests.  All of the questions were answered and there is agreement to proceed.  Consent signed and in chart.  Thank you for this interesting consult.  I greatly  enjoyed meeting Amber Whitehead and look forward to participating in their care.  A copy of this report was sent to the requesting provider on this date.  Electronically Signed: Sandi Mariscal, MD 01/01/2019, 8:52 AM   I spent a total of 15 Minutes in face to face in clinical consultation, greater than 50% of which was counseling/coordinating care for ultrasound-guided biopsy of indeterminate right inguinal collection/mass

## 2019-01-01 NOTE — Procedures (Signed)
Pre Procedure Dx: Right inguinal partially solid though predominantly cystic collection/mass Post Procedural Dx: Same  Technically successful US guided aspiration of approximately 50 cc of slightly blood tinged fluid followed by core need biopsy of solid fatty component of right inguinal mass/collection.   EBL: None  No immediate complications.   Ronny Bacon, MD Pager #: [phone]

## 2019-01-01 NOTE — Discharge Instructions (Signed)
Moderate Conscious Sedation, Adult, Care After °These instructions provide you with information about caring for yourself after your procedure. Your health care provider may also give you more specific instructions. Your treatment has been planned according to current medical practices, but problems sometimes occur. Call your health care provider if you have any problems or questions after your procedure. °What can I expect after the procedure? °After your procedure, it is common: °· To feel sleepy for several hours. °· To feel clumsy and have poor balance for several hours. °· To have poor judgment for several hours. °· To vomit if you eat too soon. °Follow these instructions at home: °For at least 24 hours after the procedure: ° °· Do not: °? Participate in activities where you could fall or become injured. °? Drive. °? Use heavy machinery. °? Drink alcohol. °? Take sleeping pills or medicines that cause drowsiness. °? Make important decisions or sign legal documents. °? Take care of children on your own. °· Rest. °Eating and drinking °· Follow the diet recommended by your health care provider. °· If you vomit: °? Drink water, juice, or soup when you can drink without vomiting. °? Make sure you have little or no nausea before eating solid foods. °General instructions °· Have a responsible adult stay with you until you are awake and alert. °· Take over-the-counter and prescription medicines only as told by your health care provider. °· If you smoke, do not smoke without supervision. °· Keep all follow-up visits as told by your health care provider. This is important. °Contact a health care provider if: °· You keep feeling nauseous or you keep vomiting. °· You feel light-headed. °· You develop a rash. °· You have a fever. °Get help right away if: °· You have trouble breathing. °This information is not intended to replace advice given to you by your health care provider. Make sure you discuss any questions you have  with your health care provider. °Document Released: 04/02/2013 Document Revised: 05/25/2017 Document Reviewed: 10/02/2015 °Elsevier Patient Education © 2020 [employer] °Needle Biopsy, Care After °This sheet gives you information about how to care for yourself after your procedure. Your health care provider may also give you more specific instructions. If you have problems or questions, contact your health care provider. °What can I expect after the procedure? °After the procedure, it is common to have soreness, bruising, or mild pain at the puncture site. This should go away in a few days. °Follow these instructions at home: °Needle insertion site care ° °· Wash your hands with soap and water before you change your bandage (dressing). If you cannot use soap and water, use hand sanitizer. °· Follow instructions from your health care provider about how to take care of your puncture site. This includes: °? When and how to change your dressing. °? When to remove your dressing. °· Check your puncture site every day for signs of infection. Check for: °? Redness, swelling, or pain. °? Fluid or blood. °? Pus or a bad smell. °? Warmth. °General instructions °· Return to your normal activities as told by your health care provider. Ask your health care provider what activities are safe for you. °· Do not take baths, swim, or use a hot tub until your health care provider approves. Ask your health care provider if you may take showers. You may only be allowed to take sponge baths. °· Take over-the-counter and prescription medicines only as told by your health care provider. °· Keep all follow-up   visits as told by your health care provider. This is important. °Contact a health care provider if: °· You have a fever. °· You have redness, swelling, or pain at the puncture site that lasts longer than a few days. °· You have fluid, blood, or pus coming from your puncture site. °· Your puncture site feels warm to the touch. °Get  help right away if: °· You have severe bleeding from the puncture site. °Summary °· After the procedure, it is common to have soreness, bruising, or mild pain at the puncture site. This should go away in a few days. °· Check your puncture site every day for signs of infection, such as redness, swelling, or pain. °· Get help right away if you have severe bleeding from your puncture site. °This information is not intended to replace advice given to you by your health care provider. Make sure you discuss any questions you have with your health care provider. °Document Released: 10/27/2014 Document Revised: 08/24/2017 Document Reviewed: 06/25/2017 °Elsevier Patient Education © 2020 [employer] ° °

## 2019-01-02 LAB — CYTOLOGY - NON PAP

## 2019-01-02 LAB — SURGICAL PATHOLOGY

## 2019-01-14 ENCOUNTER — Telehealth: Payer: Self-pay | Admitting: General Surgery

## 2019-01-14 NOTE — Telephone Encounter (Signed)
Patient is calling and said she got her results back, but has not heard what the next step will be, if the patient will be needing surgery. Please call patient and advise

## 2019-01-15 ENCOUNTER — Telehealth: Payer: Self-pay | Admitting: General Surgery

## 2019-01-15 DIAGNOSIS — E278 Other specified disorders of adrenal gland: Secondary | ICD-10-CM

## 2019-01-15 MED ORDER — DEXAMETHASONE 1 MG PO TABS: 1.0000 mg | ORAL_TABLET | Freq: Once | ORAL | 0 refills | Status: AC

## 2019-01-15 NOTE — Telephone Encounter (Signed)
I contacted Amber Whitehead with the results of her biopsy.  As both I and Dr. Pascal Lux had suspected, the findings were consistent with fat, presumably omentum, herniating through a small femoral hernia.  She is completely asymptomatic from the hernia.  I suggested that she could have it repaired on an elective basis.  A secondary finding on her CT scan was an adrenal nodule.  We will perform a biochemical work-up to exclude hyperfunctioning nodule.  If this is negative, we will have a CT scan done in about a year to assess stability.  I will place the orders for her labs.  She is currently on vacation but will have them done when she returns.

## 2019-01-29 ENCOUNTER — Telehealth: Payer: Self-pay | Admitting: *Deleted

## 2019-01-29 ENCOUNTER — Other Ambulatory Visit: Payer: Self-pay | Admitting: General Surgery

## 2019-01-29 NOTE — Telephone Encounter (Signed)
Patient called the office and states that Amber Whitehead did not have order for labs.   The patient states that she will be going to [facility] on [address]. Lab orders have been faxed today.

## 2019-02-06 ENCOUNTER — Encounter: Payer: Self-pay | Admitting: General Surgery

## 2019-03-13 ENCOUNTER — Other Ambulatory Visit: Payer: Self-pay

## 2019-03-13 ENCOUNTER — Encounter: Payer: Self-pay | Admitting: Family Medicine

## 2019-03-13 ENCOUNTER — Ambulatory Visit (INDEPENDENT_AMBULATORY_CARE_PROVIDER_SITE_OTHER): Payer: BC Managed Care – PPO | Admitting: Family Medicine

## 2019-03-13 VITALS — BP 131/84 | HR 84 | Temp 96.9°F | Ht 60.0 in | Wt 204.6 lb

## 2019-03-13 DIAGNOSIS — F330 Major depressive disorder, recurrent, mild: Secondary | ICD-10-CM

## 2019-03-13 DIAGNOSIS — Z114 Encounter for screening for human immunodeficiency virus [HIV]: Secondary | ICD-10-CM

## 2019-03-13 DIAGNOSIS — Z0000 Encounter for general adult medical examination without abnormal findings: Secondary | ICD-10-CM

## 2019-03-13 DIAGNOSIS — E669 Obesity, unspecified: Secondary | ICD-10-CM

## 2019-03-13 DIAGNOSIS — Z23 Encounter for immunization: Secondary | ICD-10-CM | POA: Diagnosis not present

## 2019-03-13 DIAGNOSIS — Z6839 Body mass index (BMI) 39.0-39.9, adult: Secondary | ICD-10-CM

## 2019-03-13 NOTE — Progress Notes (Signed)
Patient: Amber Whitehead, Female    DOB: 1980/10/10, 38 y.o.   MRN: [id number] Visit Date: 03/13/2019  Today's Provider: Lavon Paganini, MD   Chief Complaint  Patient presents with  . Annual Exam   Subjective:    I, Tiburcio Pea, CMA, am acting as a scribe for Lavon Paganini, MD.    Annual physical exam Amber Whitehead is a 38 y.o. female who presents today for health maintenance and complete physical. She feels well. She reports exercising none at this time. She reports she is sleeping fairly well.  ----------------------------------------------------------------- Last pap smear at [facility] - will send ROI  Review of Systems  Constitutional: Negative.   HENT: Negative.   Eyes: Negative.   Respiratory: Negative.   Cardiovascular: Negative.   Gastrointestinal: Positive for abdominal pain and diarrhea.  Endocrine: Negative.   Genitourinary: Negative.   Musculoskeletal: Negative.   Skin: Negative.   Allergic/Immunologic: Negative.   Neurological: Negative.   Hematological: Negative.   Psychiatric/Behavioral: Negative.     Social History      She  reports that she has never smoked. She has never used smokeless tobacco. She reports that she does not drink alcohol or use drugs.       Social History   Socioeconomic History  . Marital status: Married    Spouse name: Not on file  . Number of children: Not on file  . Years of education: Not on file  . Highest education level: Not on file  Occupational History  . Not on file  Social Needs  . Financial resource strain: Not on file  . Food insecurity    Worry: Not on file    Inability: Not on file  . Transportation needs    Medical: Not on file    Non-medical: Not on file  Tobacco Use  . Smoking status: Never Smoker  . Smokeless tobacco: Never Used  Substance and Sexual Activity  . Alcohol use: No  . Drug use: No  . Sexual activity: Not on file  Lifestyle  . Physical activity    Days  per week: Not on file    Minutes per session: Not on file  . Stress: Not on file  Relationships  . Social Herbalist on phone: Not on file    Gets together: Not on file    Attends religious service: Not on file    Active member of club or organization: Not on file    Attends meetings of clubs or organizations: Not on file    Relationship status: Not on file  Other Topics Concern  . Not on file  Social History Narrative  . Not on file    Past Medical History:  Diagnosis Date  . Depression   . History of premature delivery    of twins  . Irritable bowel syndrome (IBS)   . Pneumonia 10/2014  . Seasonal allergies   . Uterine fibroid      Patient Active Problem List   Diagnosis Date Noted  . Uterine leiomyoma 11/06/2018  . Menorrhagia 11/06/2018  . Right groin mass 11/06/2018  . History of deep vein thrombosis (DVT) of lower extremity 11/06/2018  . MDD (major depressive disorder) 07/20/2009  . Adiposity 07/20/2009    Past Surgical History:  Procedure Laterality Date  . ROBOT ASSISTED MYOMECTOMY N/A 07/12/2018   Procedure: XI ROBOTIC ASSISTED LAPAROSCOPIC MYOMECTOMY;  Surgeon: Governor Specking, MD;  Location: [facility];  Service: Gynecology;  Laterality: N/A;  . WISDOM TOOTH EXTRACTION      Family History        Family Status  Relation Name Status  . Mother  Alive  . Father  Alive  . Neg Hx  (Not Specified)        Her family history is negative for Breast cancer and Cancer - Colon.      No Known Allergies   Current Outpatient Medications:  .  sertraline (ZOLOFT) 50 MG tablet, Take 50 mg by mouth at bedtime. , Disp: , Rfl:    Patient Care Team: Virginia Crews, MD as PCP - General (Family Medicine)    Objective:    Vitals: BP 131/84 (BP Location: Right Arm, Patient Position: Sitting, Cuff Size: Normal)   Pulse 84   Temp (!) 96.9 F (36.1 C) (Temporal)   Ht 5' (1.524 m)   Wt 204 lb 9.6 oz (92.8 kg)   SpO2 99%   BMI  39.96 kg/m    Vitals:   03/13/19 1402  BP: 131/84  Pulse: 84  Temp: (!) 96.9 F (36.1 C)  TempSrc: Temporal  SpO2: 99%  Weight: 204 lb 9.6 oz (92.8 kg)  Height: 5' (1.524 m)     Physical Exam Vitals signs reviewed.  Constitutional:      General: She is not in acute distress.    Appearance: Normal appearance. She is well-developed. She is not diaphoretic.  HENT:     Head: Normocephalic and atraumatic.     Right Ear: Tympanic membrane, ear canal and external ear normal.     Left Ear: Tympanic membrane, ear canal and external ear normal.  Eyes:     General: No scleral icterus.    Conjunctiva/sclera: Conjunctivae normal.     Pupils: Pupils are equal, round, and reactive to light.  Neck:     Musculoskeletal: Neck supple.     Thyroid: No thyromegaly.  Cardiovascular:     Rate and Rhythm: Normal rate and regular rhythm.     Pulses: Normal pulses.     Heart sounds: Normal heart sounds. No murmur.  Pulmonary:     Effort: Pulmonary effort is normal. No respiratory distress.     Breath sounds: Normal breath sounds. No wheezing or rales.  Abdominal:     General: There is no distension.     Palpations: Abdomen is soft.     Tenderness: There is no abdominal tenderness.  Musculoskeletal:        General: No deformity.     Right lower leg: No edema.     Left lower leg: No edema.  Lymphadenopathy:     Cervical: No cervical adenopathy.  Skin:    General: Skin is warm and dry.     Capillary Refill: Capillary refill takes less than 2 seconds.     Findings: No rash.  Neurological:     Mental Status: She is alert and oriented to person, place, and time. Mental status is at baseline.  Psychiatric:        Mood and Affect: Mood normal.        Behavior: Behavior normal.        Thought Content: Thought content normal.      Depression Screen PHQ 2/9 Scores 03/13/2019 11/06/2018  PHQ - 2 Score 1 2  PHQ- 9 Score 5 7      Assessment & Plan:     Routine Health Maintenance and  Physical Exam  Exercise Activities and Dietary recommendations Goals   None  Immunization History  Administered Date(s) Administered  . Influenza Inj Mdck Quad Pf 04/01/2018  . Influenza,inj,Quad PF,6+ Mos 03/13/2019  . Tdap 03/13/2019    Health Maintenance  Topic Date Due  . HIV Screening  04/28/1994  . TETANUS/TDAP  04/28/1998  . PAP SMEAR-Modifier  04/28/2000  . INFLUENZA VACCINE  Completed     Discussed health benefits of physical activity, and encouraged her to engage in regular exercise appropriate for her age and condition.    --------------------------------------------------------------------  Problem List Items Addressed This Visit      Other   MDD (major depressive disorder)    Chronic and well controlled Continue Zoloft 50mg  qhs Discussed therapy Contracted for safety      Adiposity    Discussed importance of healthy weight management Discussed diet and exercise       Relevant Orders   Lipid panel   Comprehensive metabolic panel    Other Visit Diagnoses    Encounter for annual physical exam    -  Primary   Relevant Orders   Lipid panel   Comprehensive metabolic panel   HIV antibody (with reflex)   Screening for HIV (human immunodeficiency virus)       Relevant Orders   HIV antibody (with reflex)       Return in about 1 year (around 03/12/2020) for CPE.   The entirety of the information documented in the History of Present Illness, Review of Systems and Physical Exam were personally obtained by me. Portions of this information were initially documented by Tiburcio Pea, CMA and reviewed by me for thoroughness and accuracy.    , Dionne Bucy, MD MPH [facility]

## 2019-03-13 NOTE — Assessment & Plan Note (Signed)
Discussed importance of healthy weight management Discussed diet and exercise  

## 2019-03-13 NOTE — Patient Instructions (Addendum)
Preventive Care 40-39 Years Old, Female Preventive care refers to visits with your health care provider and lifestyle choices that can promote health and wellness. This includes:  A yearly physical exam. This may also be called an annual well check.  Regular dental visits and eye exams.  Immunizations.  Screening for certain conditions.  Healthy lifestyle choices, such as eating a healthy diet, getting regular exercise, not using drugs or products that contain nicotine and tobacco, and limiting alcohol use. What can I expect for my preventive care visit? Physical exam Your health care provider will check your:  Height and weight. This may be used to calculate body mass index (BMI), which tells if you are at a healthy weight.  Heart rate and blood pressure.  Skin for abnormal spots. Counseling Your health care provider may ask you questions about your:  Alcohol, tobacco, and drug use.  Emotional well-being.  Home and relationship well-being.  Sexual activity.  Eating habits.  Work and work environment.  Method of birth control.  Menstrual cycle.  Pregnancy history. What immunizations do I need?  Influenza (flu) vaccine  This is recommended every year. Tetanus, diphtheria, and pertussis (Tdap) vaccine  You may need a Td booster every 10 years. Varicella (chickenpox) vaccine  You may need this if you have not been vaccinated. Human papillomavirus (HPV) vaccine  If recommended by your health care provider, you may need three doses over 6 months. Measles, mumps, and rubella (MMR) vaccine  You may need at least one dose of MMR. You may also need a second dose. Meningococcal conjugate (MenACWY) vaccine  One dose is recommended if you are age 19-21 years and a first-year college student living in a residence hall, or if you have one of several medical conditions. You may also need additional booster doses. Pneumococcal conjugate (PCV13) vaccine  You may need  this if you have certain conditions and were not previously vaccinated. Pneumococcal polysaccharide (PPSV23) vaccine  You may need one or two doses if you smoke cigarettes or if you have certain conditions. Hepatitis A vaccine  You may need this if you have certain conditions or if you travel or work in places where you may be exposed to hepatitis A. Hepatitis B vaccine  You may need this if you have certain conditions or if you travel or work in places where you may be exposed to hepatitis B. Haemophilus influenzae type b (Hib) vaccine  You may need this if you have certain conditions. You may receive vaccines as individual doses or as more than one vaccine together in one shot (combination vaccines). Talk with your health care provider about the risks and benefits of combination vaccines. What tests do I need?  Blood tests  Lipid and cholesterol levels. These may be checked every 5 years starting at age 20.  Hepatitis C test.  Hepatitis B test. Screening  Diabetes screening. This is done by checking your blood sugar (glucose) after you have not eaten for a while (fasting).  Sexually transmitted disease (STD) testing.  BRCA-related cancer screening. This may be done if you have a family history of breast, ovarian, tubal, or peritoneal cancers.  Pelvic exam and Pap test. This may be done every 3 years starting at age 21. Starting at age 30, this may be done every 5 years if you have a Pap test in combination with an HPV test. Talk with your health care provider about your test results, treatment options, and if necessary, the need for more tests.   Follow these instructions at home: Eating and drinking   Eat a diet that includes fresh fruits and vegetables, whole grains, lean protein, and low-fat dairy.  Take vitamin and mineral supplements as recommended by your health care provider.  Do not drink alcohol if: ? Your health care provider tells you not to drink. ? You are  pregnant, may be pregnant, or are planning to become pregnant.  If you drink alcohol: ? Limit how much you have to 0-1 drink a day. ? Be aware of how much alcohol is in your drink. In the [place] one drink equals one 12 oz bottle of beer (355 mL), one 5 oz glass of wine (148 mL), or one 1 oz glass of hard liquor (44 mL). Lifestyle  Take daily care of your teeth and gums.  Stay active. Exercise for at least 30 minutes on 5 or more days each week.  Do not use any products that contain nicotine or tobacco, such as cigarettes, e-cigarettes, and chewing tobacco. If you need help quitting, ask your health care provider.  If you are sexually active, practice safe sex. Use a condom or other form of birth control (contraception) in order to prevent pregnancy and STIs (sexually transmitted infections). If you plan to become pregnant, see your health care provider for a preconception visit. What's next?  Visit your health care provider once a year for a well check visit.  Ask your health care provider how often you should have your eyes and teeth checked.  Stay up to date on all vaccines. This information is not intended to replace advice given to you by your health care provider. Make sure you discuss any questions you have with your health care provider. Document Released: 08/08/2001 Document Revised: 02/21/2018 Document Reviewed: 02/21/2018 Elsevier Patient Education  [place]  FODMAPs (fermentable oligosaccharides, disaccharides, monosaccharides, and polyols) are sugars that are hard for some people to digest. A low-FODMAP eating plan may help some people who have bowel (intestinal) diseases to manage their symptoms. This meal plan can be complicated to follow. Work with a diet and nutrition specialist (dietitian) to make a low-FODMAP eating plan that is right for you. A dietitian can make sure that you get enough nutrition from this diet. What are tips for  following this plan? Reading food labels  Check labels for hidden FODMAPs such as: ? High-fructose syrup. ? Honey. ? Agave. ? Natural fruit flavors. ? Onion or garlic powder.  Choose low-FODMAP foods that contain 3-4 grams of fiber per serving.  Check food labels for serving sizes. Eat only one serving at a time to make sure FODMAP levels stay low. Meal planning  Follow a low-FODMAP eating plan for up to 6 weeks, or as told by your health care provider or dietitian.  To follow the eating plan: 1. Eliminate high-FODMAP foods from your diet completely. 2. Gradually reintroduce high-FODMAP foods into your diet one at a time. Most people should wait a few days after introducing one high-FODMAP food before they introduce the next high-FODMAP food. Your dietitian can recommend how quickly you may reintroduce foods. 3. Keep a daily record of what you eat and drink, and make note of any symptoms that you have after eating. 4. Review your daily record with a dietitian regularly. Your dietitian can help you identify which foods you can eat and which foods you should avoid. General tips  Drink enough fluid each day to keep your urine pale yellow.  Avoid processed foods. These often have added sugar and may be high in FODMAPs.  Avoid most dairy products, whole grains, and sweeteners.  Work with a dietitian to make sure you get enough fiber in your diet. Recommended foods Grains  Gluten-free grains, such as rice, oats, buckwheat, quinoa, corn, polenta, and millet. Gluten-free pasta, bread, or cereal. Rice noodles. Corn tortillas. Vegetables  Eggplant, zucchini, cucumber, peppers, green beans, Brussels sprouts, bean sprouts, lettuce, arugula, kale, Swiss chard, spinach, collard greens, bok choy, summer squash, potato, and tomato. Limited amounts of corn, carrot, and sweet potato. Green parts of scallions. Fruits  Bananas, oranges, lemons, limes, blueberries, raspberries, strawberries,  grapes, cantaloupe, honeydew melon, kiwi, papaya, passion fruit, and pineapple. Limited amounts of dried cranberries, banana chips, and shredded coconut. Dairy  Lactose-free milk, yogurt, and kefir. Lactose-free cottage cheese and ice cream. Non-dairy milks, such as almond, coconut, hemp, and rice milk. Yogurts made of non-dairy milks. Limited amounts of goat cheese, brie, mozzarella, parmesan, swiss, and other hard cheeses. Meats and other protein foods  Unseasoned beef, pork, poultry, or fish. Eggs. Berniece Salines. Tofu (firm) and tempeh. Limited amounts of nuts and seeds, such as almonds, walnuts, [place] nuts, pecans, peanuts, pumpkin seeds, chia seeds, and sunflower seeds. Fats and oils  Butter-free spreads. Vegetable oils, such as olive, canola, and sunflower oil. Seasoning and other foods  Artificial sweeteners with names that do not end in "ol" such as aspartame, saccharine, and stevia. Maple syrup, white table sugar, raw sugar, brown sugar, and molasses. Fresh basil, coriander, parsley, rosemary, and thyme. Beverages  Water and mineral water. Sugar-sweetened soft drinks. Small amounts of orange juice or cranberry juice. Black and green tea. Most dry wines. Coffee. This may not be a complete list of low-FODMAP foods. Talk with your dietitian for more information. Foods to avoid Grains  Wheat, including kamut, durum, and semolina. Barley and bulgur. Couscous. Wheat-based cereals. Wheat noodles, bread, crackers, and pastries. Vegetables  Chicory root, artichoke, asparagus, cabbage, snow peas, sugar snap peas, mushrooms, and cauliflower. Onions, garlic, leeks, and the white part of scallions. Fruits  Fresh, dried, and juiced forms of apple, pear, watermelon, peach, plum, cherries, apricots, blackberries, boysenberries, figs, nectarines, and mango. Avocado. Dairy  Milk, yogurt, ice cream, and soft cheese. Cream and sour cream. Milk-based sauces. Custard. Meats and other protein foods  Fried  or fatty meat. Sausage. Cashews and pistachios. Soybeans, baked beans, black beans, chickpeas, kidney beans, fava beans, navy beans, lentils, and split peas. Seasoning and other foods  Any sugar-free gum or candy. Foods that contain artificial sweeteners such as sorbitol, mannitol, isomalt, or xylitol. Foods that contain honey, high-fructose corn syrup, or agave. Bouillon, vegetable stock, beef stock, and chicken stock. Garlic and onion powder. Condiments made with onion, such as hummus, chutney, pickles, relish, salad dressing, and salsa. Tomato paste. Beverages  Chicory-based drinks. Coffee substitutes. Chamomile tea. Fennel tea. Sweet or fortified wines such as port or sherry. Diet soft drinks made with isomalt, mannitol, maltitol, sorbitol, or xylitol. Apple, pear, and mango juice. Juices with high-fructose corn syrup. This may not be a complete list of high-FODMAP foods. Talk with your dietitian to discuss what dietary choices are best for you.  Summary  A low-FODMAP eating plan is a short-term diet that eliminates FODMAPs from your diet to help ease symptoms of certain bowel diseases.  The eating plan usually lasts up to 6 weeks. After that, high-FODMAP foods are restarted gradually, one at a time, so you can find out which may be  causing symptoms.  A low-FODMAP eating plan can be complicated. It is best to work with a dietitian who has experience with this type of plan. This information is not intended to replace advice given to you by your health care provider. Make sure you discuss any questions you have with your health care provider. Document Released: 02/06/2017 Document Revised: 05/25/2017 Document Reviewed: 02/06/2017 Elsevier Patient Education  2020 [employer].

## 2019-03-13 NOTE — Assessment & Plan Note (Signed)
Chronic and well controlled Continue Zoloft 50mg  qhs Discussed therapy Contracted for safety

## 2019-03-14 ENCOUNTER — Telehealth: Payer: Self-pay

## 2019-03-14 LAB — COMPREHENSIVE METABOLIC PANEL
ALT: 11 IU/L (ref 0–32)
AST: 13 IU/L (ref 0–40)
Albumin/Globulin Ratio: 2.3 — ABNORMAL HIGH (ref 1.2–2.2)
Albumin: 4.5 g/dL (ref 3.8–4.8)
Alkaline Phosphatase: 61 IU/L (ref 39–117)
BUN/Creatinine Ratio: 22 (ref 9–23)
BUN: 15 mg/dL (ref 6–20)
Bilirubin Total: <0.2 mg/dL (ref 0.0–1.2)
CO2: 23 mmol/L (ref 20–29)
Calcium: 9.1 mg/dL (ref 8.7–10.2)
Chloride: 105 mmol/L (ref 96–106)
Creatinine, Ser: 0.69 mg/dL (ref 0.57–1.00)
GFR calc Af Amer: 127 mL/min/{1.73_m2} (ref >59)
GFR calc non Af Amer: 110 mL/min/{1.73_m2} (ref >59)
Globulin, Total: 2 g/dL (ref 1.5–4.5)
Glucose: 98 mg/dL (ref 65–99)
Potassium: 4 mmol/L (ref 3.5–5.2)
Sodium: 141 mmol/L (ref 134–144)
Total Protein: 6.5 g/dL (ref 6.0–8.5)

## 2019-03-14 LAB — LIPID PANEL
Chol/HDL Ratio: 3.1 ratio (ref 0.0–4.4)
Cholesterol, Total: 156 mg/dL (ref 100–199)
HDL: 51 mg/dL (ref >39)
LDL Chol Calc (NIH): 85 mg/dL (ref 0–99)
Triglycerides: 109 mg/dL (ref 0–149)
VLDL Cholesterol Cal: 20 mg/dL (ref 5–40)

## 2019-03-14 LAB — HIV ANTIBODY (ROUTINE TESTING W REFLEX): HIV Screen 4th Generation wRfx: NONREACTIVE

## 2019-03-14 NOTE — Telephone Encounter (Signed)
Patient was advised.  

## 2019-03-14 NOTE — Telephone Encounter (Signed)
-----   Message from Virginia Crews, MD sent at 03/14/2019 10:46 AM EDT ----- Normal labs

## 2019-09-10 LAB — CATECHOLAMINES, FRACTIONATED, PLASMA
Dopamine: <30 pg/mL (ref 0–48)
Epinephrine: <15 pg/mL (ref 0–62)
Norepinephrine: 347 pg/mL (ref 0–874)

## 2019-09-10 LAB — METANEPHRINES, PLASMA
Metanephrine, Free: 11.4 pg/mL (ref 0.0–88.0)
Normetanephrine, Free: 43.8 pg/mL (ref 0.0–110.1)

## 2019-09-10 LAB — ALDOSTERONE + RENIN ACTIVITY W/ RATIO
ALDOS/RENIN RATIO: 8.8 (ref 0.0–30.0)
ALDOSTERONE: 8.7 ng/dL (ref 0.0–30.0)
Renin: 0.989 ng/mL/hr (ref 0.167–5.380)

## 2019-09-10 LAB — ACTH: ACTH: 1.8 pg/mL — ABNORMAL LOW (ref 7.2–63.3)

## 2019-09-10 LAB — CORTISOL: Cortisol: 0.9 ug/dL

## 2019-09-10 LAB — DEXAMETHASONE, BLOOD: Dexamethasone, Blood: 171 ng/dL

## 2019-09-23 LAB — HM MAMMOGRAPHY: HM Mammogram: NORMAL (ref 0–4)

## 2019-11-07 IMAGING — CT CT ABDOMEN AND PELVIS WITH CONTRAST
2 of 4 series · 15 of 46 positions shown, 17 images · IV contrast (omnipaque)
Comparison: Soft tissue ultrasound, 11/14/2018

CLINICAL DATA: Follow-up soft tissue ultrasound

EXAM:
CT ABDOMEN AND PELVIS WITH CONTRAST
TECHNIQUE: Multidetector CT imaging of the abdomen and pelvis was performed
using the standard protocol following bolus administration of
intravenous contrast.
CONTRAST:  100mL OMNIPAQUE IOHEXOL 300 MG/ML SOLN, additional oral
enteric contrast

[Series 2: abd pelvis · axial · 0.71mm/px · z∈[-1457,-1007]mm · 12 of 100 slices shown, 14 images (1 of 2)]
[im 5/100  soft-tissue]
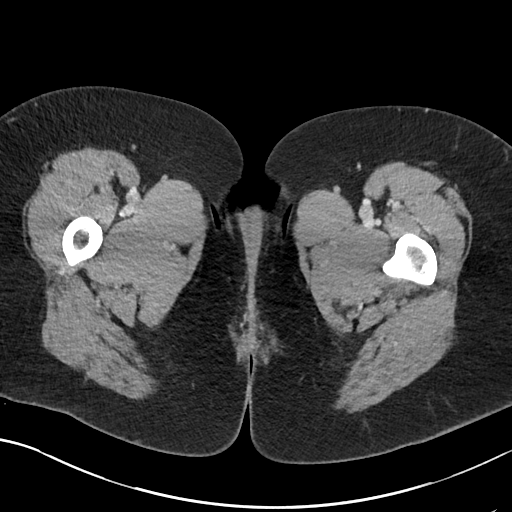
[im 5/100  bone]
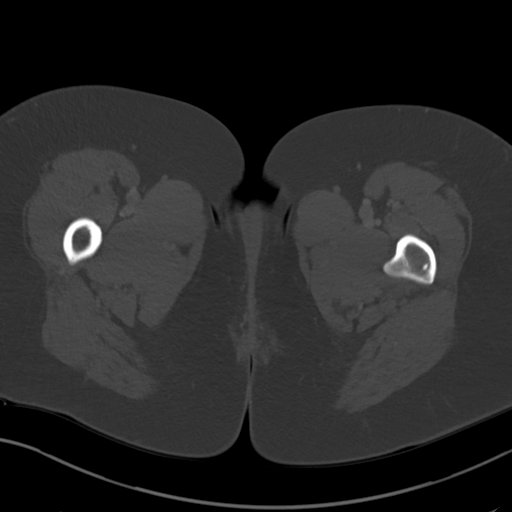
[im 13/100  soft-tissue]
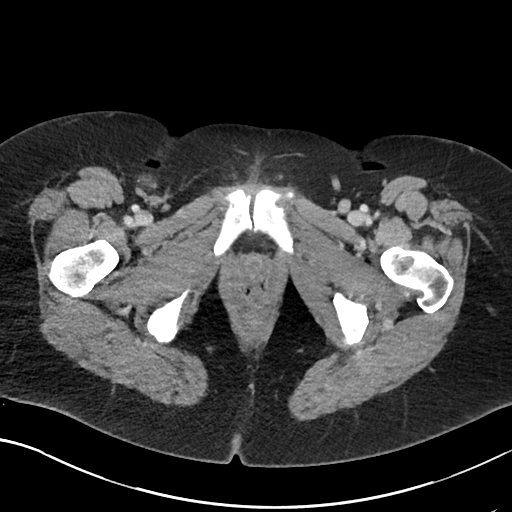
[im 22/100  soft-tissue]
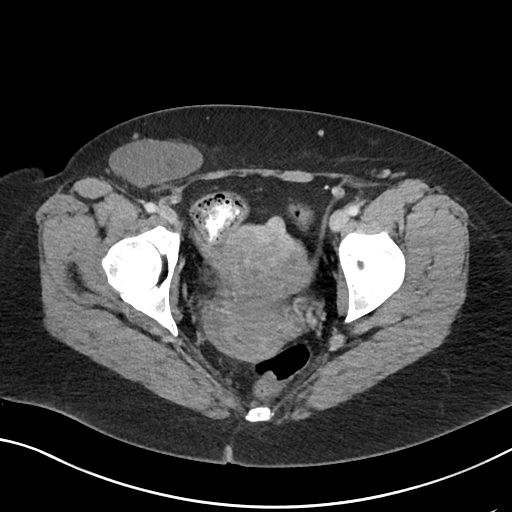
[im 31/100  soft-tissue]
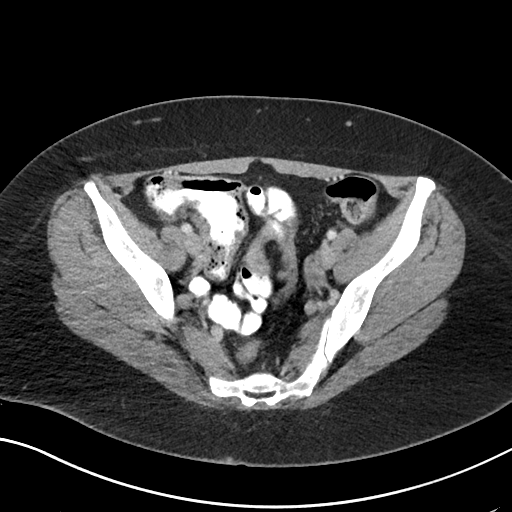
[im 39/100  soft-tissue]
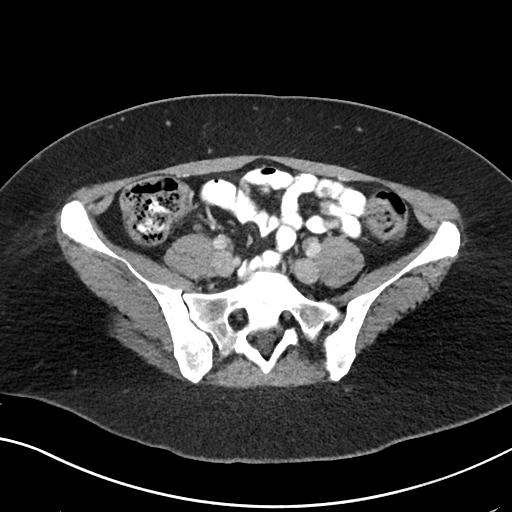
[im 48/100  soft-tissue]
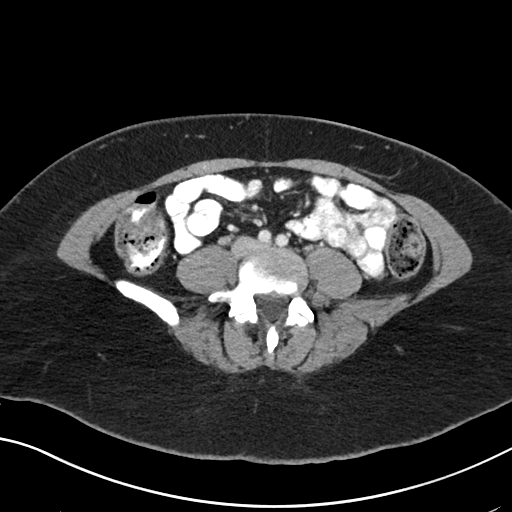
[im 52/100  soft-tissue]
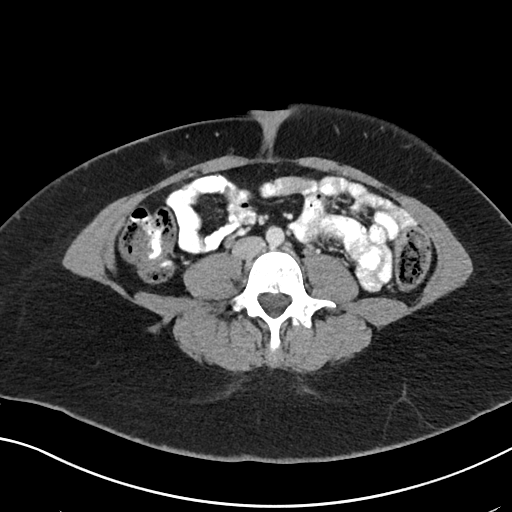
[im 61/100  soft-tissue]
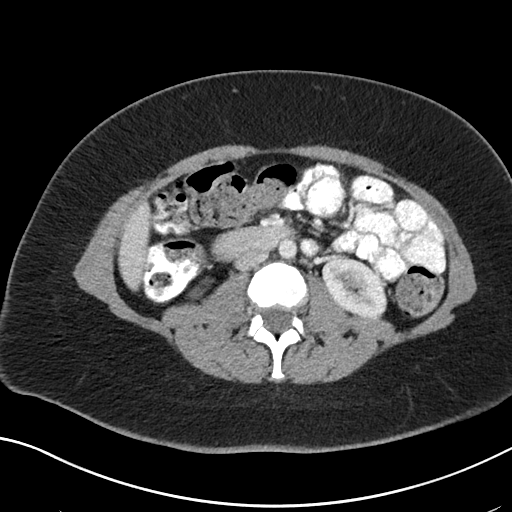
[im 69/100  soft-tissue]
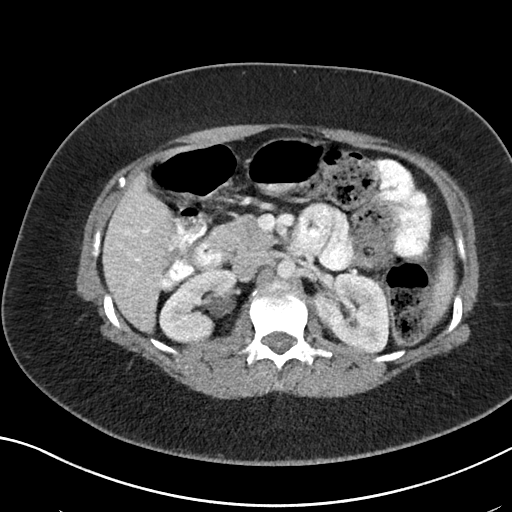
[im 69/100  bone]
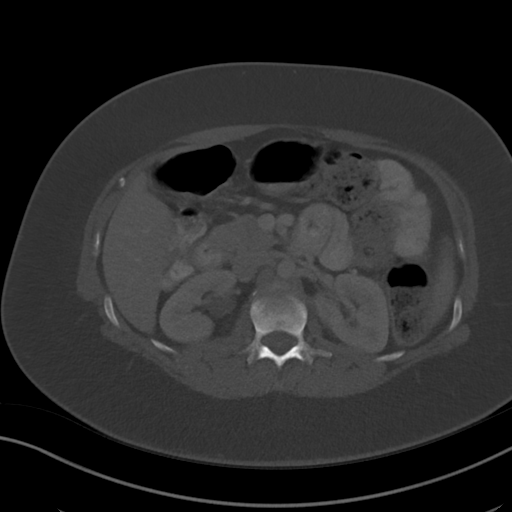
[im 78/100  soft-tissue]
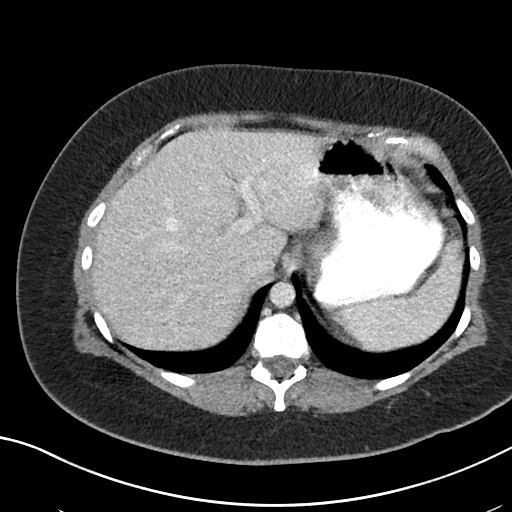
[im 87/100  soft-tissue]
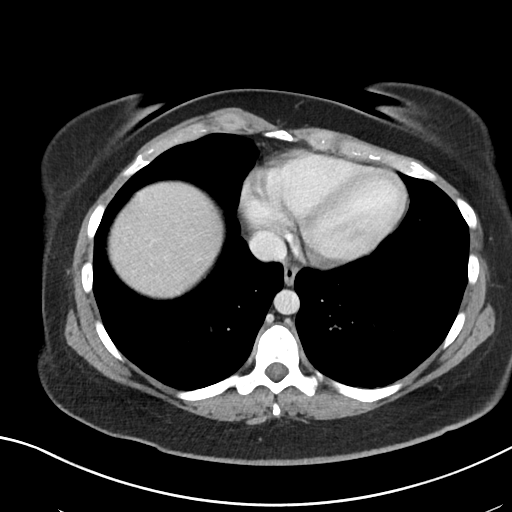
[im 95/100  soft-tissue]
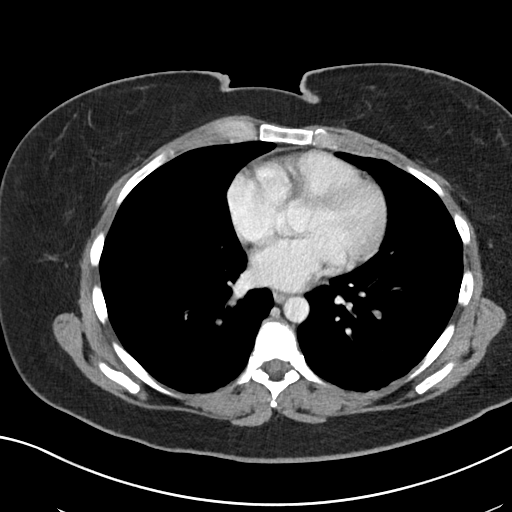

[Series 4: abd pelvis · coronal · 0.71mm/px · 3 of 147 slices shown (2 of 2)]
[im 49/147  soft-tissue]
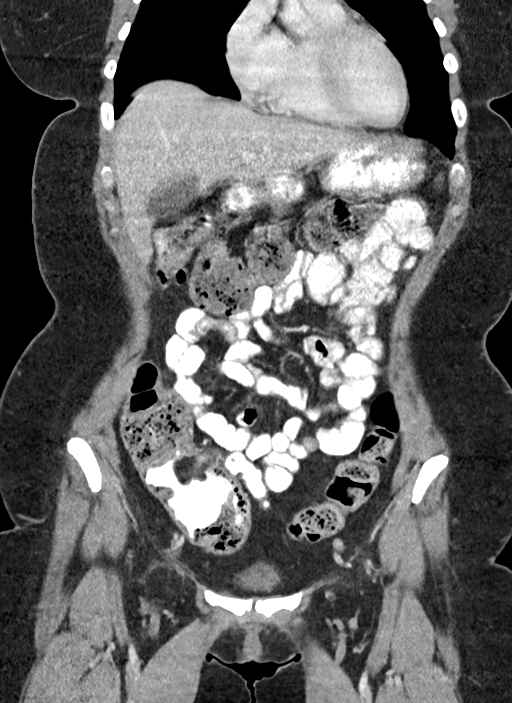
[im 65/147  soft-tissue]
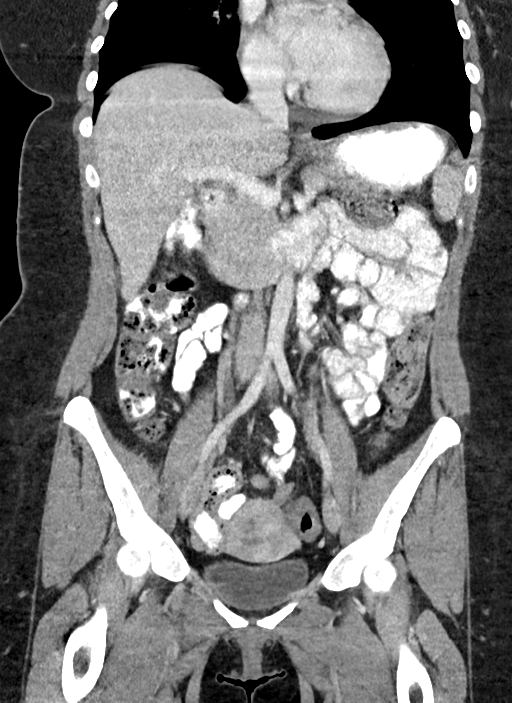
[im 82/147  soft-tissue]
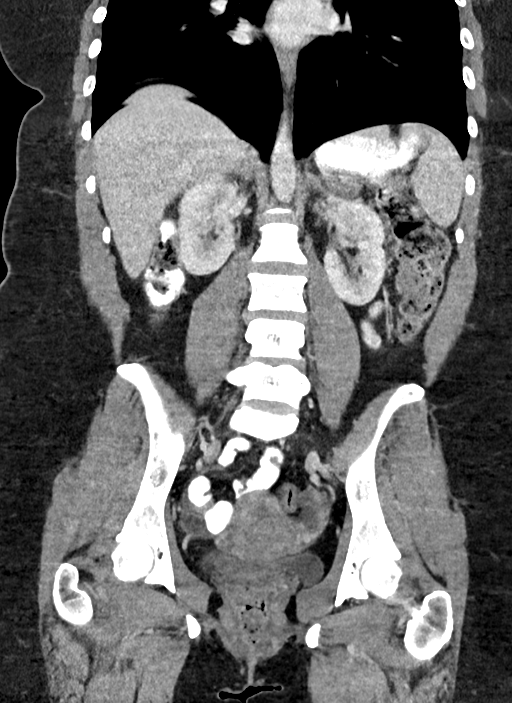

[15 of 46 positions shown; findings below may reference images not displayed]

FINDINGS: Lower chest: No acute abnormality.

Hepatobiliary: No solid liver abnormality is seen. No gallstones,
gallbladder wall thickening, or biliary dilatation.

Pancreas: Unremarkable. No pancreatic ductal dilatation or
surrounding inflammatory changes.

Spleen: Normal in size without significant abnormality.

Adrenals/Urinary Tract: There is a nonspecific soft tissue
attenuation nodule of the left adrenal gland measuring 1.9 cm.
Kidneys are normal, without renal calculi, solid lesion, or
hydronephrosis. Bladder is unremarkable.

Stomach/Bowel: Stomach is within normal limits. Appendix appears
normal. No evidence of bowel wall thickening, distention, or
inflammatory changes.

Vascular/Lymphatic: No significant vascular findings are present. No
enlarged abdominal or pelvic lymph nodes.

Reproductive: Fibroid uterus.

Other: There is a soft tissue attenuation mass measuring
approximately 8.1 x 6.7 x 3.6 cm contained within a right femoral
hernia (series 2, image 78, series 6, image 52). No abdominopelvic
ascites.

Musculoskeletal: No acute or significant osseous findings.
IMPRESSION: 1. There is a soft tissue attenuation mass measuring approximately
8.1 x 6.7 x 3.6 cm contained within a right femoral hernia (series
2, image 78, series 6, image 52). This is in keeping with prior
ultrasound and concerning for malignancy.

2. There is a nonspecific soft tissue attenuation nodule of the left
adrenal gland measuring 1.9 cm. In the absence of an established
malignancy, recommend follow-up adrenal protocol CT or MRI at 1 year
to characterize and establish stability. Otherwise attention on
follow-up.

3. No other findings in the abdomen or pelvis to suggest metastatic
disease. No lymphadenopathy.

4.  Fibroid uterus.

## 2020-03-17 ENCOUNTER — Encounter: Payer: BC Managed Care – PPO | Admitting: Family Medicine

## 2020-04-06 ENCOUNTER — Other Ambulatory Visit: Payer: Self-pay

## 2020-04-06 ENCOUNTER — Encounter: Payer: Self-pay | Admitting: Family Medicine

## 2020-04-06 ENCOUNTER — Ambulatory Visit (INDEPENDENT_AMBULATORY_CARE_PROVIDER_SITE_OTHER): Payer: BC Managed Care – PPO | Admitting: Family Medicine

## 2020-04-06 VITALS — BP 118/81 | HR 76 | Temp 99.1°F | Ht 60.0 in | Wt 240.0 lb

## 2020-04-06 DIAGNOSIS — L918 Other hypertrophic disorders of the skin: Secondary | ICD-10-CM

## 2020-04-06 DIAGNOSIS — F330 Major depressive disorder, recurrent, mild: Secondary | ICD-10-CM

## 2020-04-06 DIAGNOSIS — Z23 Encounter for immunization: Secondary | ICD-10-CM

## 2020-04-06 DIAGNOSIS — Z0000 Encounter for general adult medical examination without abnormal findings: Secondary | ICD-10-CM

## 2020-04-06 DIAGNOSIS — Z1159 Encounter for screening for other viral diseases: Secondary | ICD-10-CM

## 2020-04-06 DIAGNOSIS — E278 Other specified disorders of adrenal gland: Secondary | ICD-10-CM

## 2020-04-06 NOTE — Assessment & Plan Note (Signed)
Discussed importance of healthy weight management Discussed diet and exercise  

## 2020-04-06 NOTE — Progress Notes (Signed)
Complete physical exam   Patient: Amber Whitehead   DOB: [birth year]   39 y.o. Female  MRN: [id number] Visit Date: 04/06/2020  Today's healthcare provider: Lavon Paganini, MD   Chief Complaint  Patient presents with   Annual Exam   Subjective    Amber Whitehead is a 39 y.o. female who presents today for a complete physical exam.  She reports consuming a general diet. The patient does not participate in regular exercise at present. She generally feels well. She reports sleeping well. She does not have additional problems to discuss today.  HPI    Past Medical History:  Diagnosis Date   Depression    History of premature delivery    of twins   Irritable bowel syndrome (IBS)    Pneumonia 10/2014   Seasonal allergies    Uterine fibroid    Past Surgical History:  Procedure Laterality Date   ROBOT ASSISTED MYOMECTOMY N/A 07/12/2018   Procedure: XI ROBOTIC ASSISTED LAPAROSCOPIC MYOMECTOMY;  Surgeon: Governor Specking, MD;  Location: [facility];  Service: Gynecology;  Laterality: N/A;   WISDOM TOOTH EXTRACTION     Social History   Socioeconomic History   Marital status: Married    Spouse name: Not on file   Number of children: Not on file   Years of education: Not on file   Highest education level: Not on file  Occupational History   Not on file  Tobacco Use   Smoking status: Never Smoker   Smokeless tobacco: Never Used  Vaping Use   Vaping Use: Never used  Substance and Sexual Activity   Alcohol use: No   Drug use: No   Sexual activity: Not on file  Other Topics Concern   Not on file  Social History Narrative   Not on file   Social Determinants of Health   Financial Resource Strain:    Difficulty of Paying Living Expenses: Not on file  Food Insecurity:    Worried About [place] in the Last Year: Not on file   Ran Out of Food in the Last Year: Not on file  Transportation Needs:    Lack of  Transportation (Medical): Not on file   Lack of Transportation (Non-Medical): Not on file  Physical Activity:    Days of Exercise per Week: Not on file   Minutes of Exercise per Session: Not on file  Stress:    Feeling of Stress : Not on file  Social Connections:    Frequency of Communication with Friends and Family: Not on file   Frequency of Social Gatherings with Friends and Family: Not on file   Attends Religious Services: Not on file   Active Member of Clubs or Organizations: Not on file   Attends Archivist Meetings: Not on file   Marital Status: Not on file  Intimate Partner Violence:    Fear of Current or Ex-Partner: Not on file   Emotionally Abused: Not on file   Physically Abused: Not on file   Sexually Abused: Not on file   Family Status  Relation Name Status   Mother  Alive   Father  Alive   Sister  Alive   MGM  Alive   Sister  Alive   Neg Hx  (Not Specified)   Family History  Problem Relation Age of Onset   Healthy Mother    Healthy Father    Healthy Sister    Lung cancer Maternal Grandmother  Healthy Sister    Breast cancer Neg Hx    Cancer - Colon Neg Hx    No Known Allergies  Patient Care Team: Virginia Crews, MD as PCP - General (Family Medicine)   Medications: Outpatient Medications Prior to Visit  Medication Sig   sertraline (ZOLOFT) 50 MG tablet Take 50 mg by mouth at bedtime.    No facility-administered medications prior to visit.    Review of Systems  Constitutional: Negative.   HENT: Negative.   Eyes: Negative.   Respiratory: Negative.   Cardiovascular: Negative.   Gastrointestinal: Negative.   Genitourinary: Negative.   Musculoskeletal: Negative.   Skin: Negative.   Allergic/Immunologic: Negative.   Neurological: Negative.   Hematological: Negative.   Psychiatric/Behavioral: Negative.     Last CBC Lab Results  Component Value Date   WBC 5.5 01/01/2019   HGB 11.9 (L) 01/01/2019    HCT 39.7 01/01/2019   MCV 85.2 01/01/2019   MCH 25.5 (L) 01/01/2019   RDW 16.0 (H) 01/01/2019   PLT 330 36/14/4315   Last metabolic panel Lab Results  Component Value Date   GLUCOSE 98 03/13/2019   NA 141 03/13/2019   K 4.0 03/13/2019   CL 105 03/13/2019   CO2 23 03/13/2019   BUN 15 03/13/2019   CREATININE 0.69 03/13/2019   GFRNONAA 110 03/13/2019   GFRAA 127 03/13/2019   CALCIUM 9.1 03/13/2019   PROT 6.5 03/13/2019   ALBUMIN 4.5 03/13/2019   LABGLOB 2.0 03/13/2019   AGRATIO 2.3 (H) 03/13/2019   BILITOT <0.2 03/13/2019   ALKPHOS 61 03/13/2019   AST 13 03/13/2019   ALT 11 03/13/2019   Last lipids Lab Results  Component Value Date   CHOL 156 03/13/2019   HDL 51 03/13/2019   LDLCALC 85 03/13/2019   TRIG 109 03/13/2019   CHOLHDL 3.1 03/13/2019   Last hemoglobin A1c No results found for: HGBA1C  Objective    BP 118/81 (BP Location: Right Arm, Patient Position: Sitting, Cuff Size: Large)    Pulse 76    Temp 99.1 F (37.3 C) (Oral)    Ht 5' (1.524 m)    Wt 240 lb (108.9 kg)    BMI 46.87 kg/m    Physical Exam Vitals reviewed.  Constitutional:      General: She is not in acute distress.    Appearance: Normal appearance. She is well-developed. She is not diaphoretic.  HENT:     Head: Normocephalic and atraumatic.     Right Ear: Tympanic membrane, ear canal and external ear normal.     Left Ear: Tympanic membrane, ear canal and external ear normal.  Eyes:     General: No scleral icterus.    Conjunctiva/sclera: Conjunctivae normal.     Pupils: Pupils are equal, round, and reactive to light.  Neck:     Thyroid: No thyromegaly.  Cardiovascular:     Rate and Rhythm: Normal rate and regular rhythm.     Pulses: Normal pulses.     Heart sounds: Normal heart sounds. No murmur heard.   Pulmonary:     Effort: Pulmonary effort is normal. No respiratory distress.     Breath sounds: Normal breath sounds. No wheezing or rales.  Abdominal:     General: There is no  distension.     Palpations: Abdomen is soft.     Tenderness: There is no abdominal tenderness.  Musculoskeletal:        General: No deformity.     Cervical back: Neck supple.  Right lower leg: No edema.     Left lower leg: No edema.  Lymphadenopathy:     Cervical: No cervical adenopathy.  Skin:    General: Skin is warm and dry.     Findings: No rash.  Neurological:     Mental Status: She is alert and oriented to person, place, and time. Mental status is at baseline.     Gait: Gait normal.  Psychiatric:        Mood and Affect: Mood normal.        Behavior: Behavior normal.        Thought Content: Thought content normal.       Last depression screening scores PHQ 2/9 Scores 04/06/2020 03/13/2019 11/06/2018  PHQ - 2 Score 2 1 2   PHQ- 9 Score 6 5 7    Last fall risk screening Fall Risk  04/06/2020  Falls in the past year? 0  Number falls in past yr: 0  Injury with Fall? 0  Risk for fall due to : No Fall Risks  Follow up Falls evaluation completed   Last Audit-C alcohol use screening Alcohol Use Disorder Test (AUDIT) 04/06/2020  1. How often do you have a drink containing alcohol? 0  2. How many drinks containing alcohol do you have on a typical day when you are drinking? 0  3. How often do you have six or more drinks on one occasion? 0  AUDIT-C Score 0  Alcohol Brief Interventions/Follow-up AUDIT Score <7 follow-up not indicated   A score of 3 or more in women, and 4 or more in men indicates increased risk for alcohol abuse, EXCEPT if all of the points are from question 1   No results found for any visits on 04/06/20.  Assessment & Plan    Routine Health Maintenance and Physical Exam  Exercise Activities and Dietary recommendations Goals   None     Immunization History  Administered Date(s) Administered   DTaP 06/25/1979, 08/27/1979, 10/29/1979, 11/13/1980, 12/10/1984   Hepatitis A 04/08/2002   Hepatitis B 07/02/1998, 04/08/2002   IPV 06/25/1979,  08/27/1979, 10/29/1979, 11/13/1980   Influenza Inj Mdck Quad Pf 04/01/2018   Influenza,inj,Quad PF,6+ Mos 03/13/2019, 04/06/2020   Meningococcal Conjugate 11/11/1985   PFIZER SARS-COV-2 Vaccination 09/04/2019, 10/02/2019   Td 07/16/1997   Tdap 03/13/2019    Health Maintenance  Topic Date Due   Hepatitis C Screening  Never done   PAP SMEAR-Modifier  10/18/2020   TETANUS/TDAP  03/12/2029   INFLUENZA VACCINE  Completed   COVID-19 Vaccine  Completed   HIV Screening  Completed    Discussed health benefits of physical activity, and encouraged her to engage in regular exercise appropriate for her age and condition.  Problem List Items Addressed This Visit      Other   MDD (major depressive disorder)    Chronic and well controlled Continue Zoloft 50mg  qhs Have encouraged therapy Continue to monitor PHQ9      Morbid obesity ([place])    Discussed importance of healthy weight management Discussed diet and exercise       Relevant Orders   Comprehensive metabolic panel   Lipid panel   CBC w/Diff/Platelet    Other Visit Diagnoses    Encounter for annual physical exam    -  Primary   Relevant Orders   Hepatitis C Antibody   Comprehensive metabolic panel   Lipid panel   CBC w/Diff/Platelet   Need for influenza vaccination       Relevant Orders  Flu Vaccine QUAD 6+ mos PF IM (Fluarix Quad PF) (Completed)   Need for hepatitis C screening test       Relevant Orders   Hepatitis C Antibody   Skin tag       Relevant Orders   Ambulatory referral to Dermatology   Adrenal nodule [facility])       Relevant Orders   CT ADRENAL ABD WO       Return in about 1 year (around 04/06/2021) for CPE.     I, Lavon Paganini, MD, have reviewed all documentation for this visit. The documentation on 04/06/20 for the exam, diagnosis, procedures, and orders are all accurate and complete.   Rheagan Nayak, Dionne Bucy, MD, MPH [facility]

## 2020-04-06 NOTE — Patient Instructions (Signed)

## 2020-04-06 NOTE — Assessment & Plan Note (Signed)
Chronic and well controlled Continue Zoloft 50mg  qhs Have encouraged therapy Continue to monitor PHQ9

## 2020-04-07 ENCOUNTER — Telehealth: Payer: Self-pay | Admitting: Family Medicine

## 2020-04-07 NOTE — Telephone Encounter (Signed)
Pt insurance company is requesting a peer to peer to get CT approved Phone [phone]. Member # [id number]

## 2020-04-15 ENCOUNTER — Other Ambulatory Visit: Payer: Self-pay

## 2020-04-15 ENCOUNTER — Ambulatory Visit (INDEPENDENT_AMBULATORY_CARE_PROVIDER_SITE_OTHER): Payer: BC Managed Care – PPO

## 2020-04-15 DIAGNOSIS — Z23 Encounter for immunization: Secondary | ICD-10-CM | POA: Diagnosis not present

## 2020-04-15 NOTE — Telephone Encounter (Signed)
Peer to peer completed. Due to low ACTH, she is able to have this done.  They are processing new case. States you can call back in 2+ hours with patients name and DOB and get approval information. Thanks!

## 2020-04-16 LAB — CBC WITH DIFFERENTIAL/PLATELET
Basophils Absolute: 0.1 10*3/uL (ref 0.0–0.2)
Basos: 1 %
EOS (ABSOLUTE): 0.1 10*3/uL (ref 0.0–0.4)
Eos: 1 %
Hematocrit: 40.9 % (ref 34.0–46.6)
Hemoglobin: 13.7 g/dL (ref 11.1–15.9)
Immature Grans (Abs): 0 10*3/uL (ref 0.0–0.1)
Immature Granulocytes: 0 %
Lymphocytes Absolute: 1.7 10*3/uL (ref 0.7–3.1)
Lymphs: 30 %
MCH: 30.2 pg (ref 26.6–33.0)
MCHC: 33.5 g/dL (ref 31.5–35.7)
MCV: 90 fL (ref 79–97)
Monocytes Absolute: 0.3 10*3/uL (ref 0.1–0.9)
Monocytes: 5 %
Neutrophils Absolute: 3.6 10*3/uL (ref 1.4–7.0)
Neutrophils: 63 %
Platelets: 297 10*3/uL (ref 150–450)
RBC: 4.54 x10E6/uL (ref 3.77–5.28)
RDW: 12.3 % (ref 11.7–15.4)
WBC: 5.7 10*3/uL (ref 3.4–10.8)

## 2020-04-16 LAB — COMPREHENSIVE METABOLIC PANEL
ALT: 13 IU/L (ref 0–32)
AST: 13 IU/L (ref 0–40)
Albumin/Globulin Ratio: 2 (ref 1.2–2.2)
Albumin: 4.3 g/dL (ref 3.8–4.8)
Alkaline Phosphatase: 69 IU/L (ref 44–121)
BUN/Creatinine Ratio: 16 (ref 9–23)
BUN: 12 mg/dL (ref 6–24)
Bilirubin Total: 0.4 mg/dL (ref 0.0–1.2)
CO2: 21 mmol/L (ref 20–29)
Calcium: 9 mg/dL (ref 8.7–10.2)
Chloride: 106 mmol/L (ref 96–106)
Creatinine, Ser: 0.73 mg/dL (ref 0.57–1.00)
GFR calc Af Amer: 119 mL/min/{1.73_m2} (ref >59)
GFR calc non Af Amer: 103 mL/min/{1.73_m2} (ref >59)
Globulin, Total: 2.2 g/dL (ref 1.5–4.5)
Glucose: 87 mg/dL (ref 65–99)
Potassium: 4.4 mmol/L (ref 3.5–5.2)
Sodium: 140 mmol/L (ref 134–144)
Total Protein: 6.5 g/dL (ref 6.0–8.5)

## 2020-04-16 LAB — LIPID PANEL
Chol/HDL Ratio: 3.7 ratio (ref 0.0–4.4)
Cholesterol, Total: 157 mg/dL (ref 100–199)
HDL: 42 mg/dL (ref >39)
LDL Chol Calc (NIH): 92 mg/dL (ref 0–99)
Triglycerides: 130 mg/dL (ref 0–149)
VLDL Cholesterol Cal: 23 mg/dL (ref 5–40)

## 2020-04-16 LAB — HEPATITIS C ANTIBODY: Hep C Virus Ab: <0.1 s/co ratio (ref 0.0–0.9)

## 2020-04-22 ENCOUNTER — Encounter: Payer: Self-pay | Admitting: Family Medicine

## 2020-04-27 ENCOUNTER — Other Ambulatory Visit: Payer: Self-pay

## 2020-04-27 ENCOUNTER — Ambulatory Visit
Admission: RE | Admit: 2020-04-27 | Discharge: 2020-04-27 | Disposition: A | Discharge status: Home / Self Care | Payer: BC Managed Care – PPO | Source: Ambulatory Visit | Attending: Family Medicine | Admitting: Family Medicine

## 2020-04-27 DIAGNOSIS — E278 Other specified disorders of adrenal gland: Secondary | ICD-10-CM

## 2020-04-29 ENCOUNTER — Telehealth: Payer: Self-pay

## 2020-04-29 NOTE — Telephone Encounter (Signed)
Written by Virginia Crews, MD on 04/29/2020 8:30 AM EDT Seen by patient Amber Whitehead on 04/29/2020 8:31 AM  Seen by proxy [name] on 04/29/2020 8:31 AM

## 2020-04-29 NOTE — Telephone Encounter (Signed)
-----   Message from Virginia Crews, MD sent at 04/29/2020  8:30 AM EDT ----- Stable adrenal gland nodule. Radiologist recommends follow-up in 1 yr with CT/MRI

## 2020-08-11 ENCOUNTER — Ambulatory Visit: Payer: BC Managed Care – PPO | Admitting: Dermatology

## 2020-08-11 ENCOUNTER — Other Ambulatory Visit: Payer: Self-pay | Admitting: Dermatology

## 2020-08-11 ENCOUNTER — Other Ambulatory Visit: Payer: Self-pay

## 2020-08-11 ENCOUNTER — Encounter: Payer: Self-pay | Admitting: Dermatology

## 2020-08-11 DIAGNOSIS — L578 Other skin changes due to chronic exposure to nonionizing radiation: Secondary | ICD-10-CM | POA: Diagnosis not present

## 2020-08-11 DIAGNOSIS — D485 Neoplasm of uncertain behavior of skin: Secondary | ICD-10-CM | POA: Diagnosis not present

## 2020-08-11 DIAGNOSIS — D492 Neoplasm of unspecified behavior of bone, soft tissue, and skin: Secondary | ICD-10-CM

## 2020-08-11 DIAGNOSIS — D489 Neoplasm of uncertain behavior, unspecified: Secondary | ICD-10-CM

## 2020-08-11 NOTE — Progress Notes (Signed)
   New Patient Visit  Subjective  Amber Whitehead is a 40 y.o. female who presents for the following: New Patient (Initial Visit) (Patient is here for new initial visit and has concerns with skin tags that are raised and bothersome. Patient has skin tags on right side of temple near corner of right eye and one located on her neck. ).  Objective  Well appearing patient in no apparent distress; mood and affect are within normal limits.  A focused examination was performed including face, neck, eyelids, upper chest. Relevant physical exam findings are noted in the Assessment and Plan.  Objective  right upper eyelid lateral: 0.9 cm fleshy brown pedunculated papule      Objective  left neck: 0.7 cm fleshy brown pedunculated papule     Assessment & Plan  Neoplasm of uncertain behavior (2) right upper eyelid lateral  Epidermal / dermal shaving  Lesion diameter (cm):  0.3 Informed consent: discussed and consent obtained   Anesthesia: the lesion was anesthetized in a standard fashion   Anesthetic:  1% lidocaine w/ epinephrine 1-100,000 buffered w/ 8.4% NaHCO3 Instrument used: scissors   Hemostasis achieved with: pressure and aluminum chloride   Outcome: patient tolerated procedure well   Post-procedure details: wound care instructions given    Specimen 1 - Surgical pathology Differential Diagnosis: r/o acrochordon vs sk vs nevus vs neurofibroma   Check Margins: No 0.9 cm fleshy brown pedunculated papule  left neck  Epidermal / dermal shaving  Lesion diameter (cm):  0.3 Informed consent: discussed and consent obtained   Anesthesia: the lesion was anesthetized in a standard fashion   Anesthetic:  1% lidocaine w/ epinephrine 1-100,000 buffered w/ 8.4% NaHCO3 Instrument used: scissors   Hemostasis achieved with: pressure and aluminum chloride   Outcome: patient tolerated procedure well   Post-procedure details: wound care instructions given    Specimen 2 - Surgical  pathology Differential Diagnosis: r/o acrochordon vs sk vs nevus vs neurofibroma  Check Margins: No 0.7 cm fleshy brown pedunculated papule  r/o acrochordon vs sk vs nevus vs neurofibroma   Neoplasm of skin  Actinic Damage face - chronic, secondary to cumulative UV radiation exposure/sun exposure over time - diffuse scaly erythematous macules with underlying dyspigmentation - Recommend daily broad spectrum sunscreen SPF 30+ to sun-exposed areas, reapply every 2 hours as needed.  - Declined full body skin exam for skin cancer screening - Call for new or changing lesions.  Return for follow up as needed .  I, Ruthell Rummage, CMA, am acting as scribe for Forest Gleason, MD.  Documentation: I have reviewed the above documentation for accuracy and completeness, and I agree with the above.  Forest Gleason, MD

## 2020-08-11 NOTE — Patient Instructions (Addendum)
Melanoma ABCDEs  Melanoma is the most dangerous type of skin cancer, and is the leading cause of death from skin disease.  You are more likely to develop melanoma if you:  Have light-colored skin, light-colored eyes, or red or blond hair  Spend a lot of time in the sun  Tan regularly, either outdoors or in a tanning bed  Have had blistering sunburns, especially during childhood  Have a close family member who has had a melanoma  Have atypical moles or large birthmarks  Early detection of melanoma is key since treatment is typically straightforward and cure rates are extremely high if we catch it early.   The first sign of melanoma is often a change in a mole or a new dark spot.  The ABCDE system is a way of remembering the signs of melanoma.  A for asymmetry:  The two halves do not match. B for border:  The edges of the growth are irregular. C for color:  A mixture of colors are present instead of an even brown color. D for diameter:  Melanomas are usually (but not always) greater than 20mm - the size of a pencil eraser. E for evolution:  The spot keeps changing in size, shape, and color.  Please check your skin once per month between visits. You can use a small mirror in front and a large mirror behind you to keep an eye on the back side or your body.   If you see any new or changing lesions before your next follow-up, please call to schedule a visit.  Please continue daily skin protection including broad spectrum sunscreen SPF 30+ to sun-exposed areas, reapplying every 2 hours as needed when you're outdoors.   Recommend taking Heliocare sun protection supplement daily in sunny weather for additional sun protection. For maximum protection on the sunniest days, you can take up to 2 capsules of regular Heliocare OR take 1 capsule of Heliocare Ultra. For prolonged exposure (such as a full day in the sun), you can repeat your dose of the supplement 4 hours after your first dose. Heliocare  can be purchased at [facility] or at [email or web address].   Biopsy Wound Care Instructions  1. Leave the original bandage on for 24 hours if possible.  If the bandage becomes soaked or soiled before that time, it is OK to remove it and examine the wound.  A small amount of post-operative bleeding is normal.  If excessive bleeding occurs, remove the bandage, place gauze over the site and apply continuous pressure (no peeking) over the area for 30 minutes. If this does not work, please call our clinic as soon as possible or page your doctor if it is after hours.   2. Once a day, cleanse the wound with soap and water. It is fine to shower. If a thick crust develops you may use a Q-tip dipped into dilute hydrogen peroxide (mix 1:1 with water) to dissolve it.  Hydrogen peroxide can slow the healing process, so use it only as needed.    3. After washing, apply petroleum jelly (Vaseline) or an antibiotic ointment if your doctor prescribed one for you, followed by a bandage.    4. For best healing, the wound should be covered with a layer of ointment at all times. If you are not able to keep the area covered with a bandage to hold the ointment in place, this may mean re-applying the ointment several times a day.  Continue this wound care until the  wound has healed and is no longer open.   Itching and mild discomfort is normal during the healing process. However, if you develop pain or severe itching, please call our office.   If you have any discomfort, you can take Tylenol (acetaminophen) or ibuprofen as directed on the bottle. (Please do not take these if you have an allergy to them or cannot take them for another reason).  Some redness, tenderness and white or yellow material in the wound is normal healing.  If the area becomes very sore and red, or develops a thick yellow-green material (pus), it may be infected; please notify us.    If you have stitches, return to clinic as directed to have  the stitches removed. You will continue wound care for 2-3 days after the stitches are removed.   Wound healing continues for up to one year following surgery. It is not unusual to experience pain in the scar from time to time during the interval.  If the pain becomes severe or the scar thickens, you should notify the office.    A slight amount of redness in a scar is expected for the first six months.  After six months, the redness will fade and the scar will soften and fade.  The color difference becomes less noticeable with time.  If there are any problems, return for a post-op surgery check at your earliest convenience.  To improve the appearance of the scar, you can use silicone scar gel, cream, or sheets (such as Mederma or Serica) every night for up to one year. These are available over the counter (without a prescription).  Please call our office at [phone] for any questions or concerns.

## 2021-01-06 ENCOUNTER — Encounter: Payer: Self-pay | Admitting: Family Medicine

## 2021-03-23 ENCOUNTER — Encounter: Payer: Self-pay | Admitting: General Surgery

## 2021-04-06 ENCOUNTER — Encounter: Payer: Self-pay | Admitting: Family Medicine

## 2021-04-07 ENCOUNTER — Encounter: Payer: BC Managed Care – PPO | Admitting: Family Medicine

## 2021-04-08 NOTE — Telephone Encounter (Signed)
This patient called to schedule something with any provider in the practice but we cannot sche with the new providers.  Amber Whitehead had something on the 11/2 but that is still not soon enough.   CB#  [phone]

## 2021-04-12 ENCOUNTER — Ambulatory Visit: Payer: BC Managed Care – PPO | Admitting: Physician Assistant

## 2021-04-12 ENCOUNTER — Other Ambulatory Visit: Payer: Self-pay

## 2021-04-12 ENCOUNTER — Encounter: Payer: Self-pay | Admitting: Physician Assistant

## 2021-04-12 VITALS — BP 145/92 | HR 113 | Temp 97.9°F | Resp 16 | Wt 255.3 lb

## 2021-04-12 DIAGNOSIS — R1903 Right lower quadrant abdominal swelling, mass and lump: Secondary | ICD-10-CM | POA: Diagnosis not present

## 2021-04-12 DIAGNOSIS — R1031 Right lower quadrant pain: Secondary | ICD-10-CM | POA: Diagnosis not present

## 2021-04-12 NOTE — Patient Instructions (Signed)
If significant abdominal pain, no BM or passing gas, go to ED.

## 2021-04-12 NOTE — Progress Notes (Signed)
Established patient visit   Patient: Amber Whitehead   DOB: 1980/09/23   40 y.o. Female  MRN: [id number] Visit Date: 04/12/2021  Today's healthcare provider: Mikey Kirschner, PA-C   Chief Complaint  Patient presents with   Abdominal Pain   Subjective    Amber Whitehead is a 40 y/o female who presents today with concerns over a right abdominal pain and mass x 2-3 weeks. She feels this mass in several different positions, feels best when standing. It tender to touch and aching at rest. Denies any changes to her BM, denies constipation, bloody BM. Reports still passing gas.    Medications: Outpatient Medications Prior to Visit  Medication Sig   sertraline (ZOLOFT) 50 MG tablet Take 50 mg by mouth at bedtime.    No facility-administered medications prior to visit.    Review of Systems  Constitutional:  Negative for fever.  Gastrointestinal:  Positive for abdominal pain. Negative for constipation, diarrhea, nausea and vomiting.  Genitourinary:  Negative for dysuria, frequency and hematuria.  Musculoskeletal:  Negative for arthralgias and myalgias.  Neurological:  Negative for headaches.      Objective    Blood pressure (!) 145/92, pulse (!) 113, temperature 97.9 F (36.6 C), temperature source Oral, resp. rate 16, weight 255 lb 4.8 oz (115.8 kg), last menstrual period 03/14/2021.    Physical Exam Constitutional:      General: She is awake.     Appearance: She is well-developed. She is obese.  HENT:     Head: Normocephalic.  Eyes:     Conjunctiva/sclera: Conjunctivae normal.  Cardiovascular:     Rate and Rhythm: Normal rate.  Pulmonary:     Effort: Pulmonary effort is normal.  Abdominal:     Comments: Abdomen is nondistended. Palpable hard, fixed mass RLQ, on panus. Tender to palpation. Rest of abdomen in soft.  Skin:    General: Skin is warm.  Neurological:     Mental Status: She is alert and oriented to person, place, and time.  Psychiatric:        Attention and  Perception: Attention normal.        Mood and Affect: Mood normal.        Speech: Speech normal.        Behavior: Behavior is cooperative.     No results found for any visits on 04/12/21.  Assessment & Plan     Problem List Items Addressed This Visit       Other   Right lower quadrant abdominal mass - Primary    New RLQ mass.  History of R inguinal hernia with mass, bx 2020 as benign. No follow up after. Pt had CT w/ IV contrast 2021 to follow a left adrenal mass. Ordered CT abdomen and pelvis w/ PO and IV contrast to evaluate new palpable mass and follow left adrenal mass.      Relevant Orders   CT Abdomen Pelvis W Contrast   Other Visit Diagnoses     Right lower quadrant abdominal pain       Relevant Orders   CT Abdomen Pelvis W Contrast     Will f/u with CT results, likely need ref to GI.  Physical scheduled w/ Dr B 04/28/21    I, Mikey Kirschner, PA-C have reviewed all documentation for this visit. The documentation on 04/12/21  for the exam, diagnosis, procedures, and orders are all accurate and complete.    Mikey Kirschner, PA-C  [facility] [phone] (phone) [phone] (  fax)  [place]

## 2021-04-12 NOTE — Assessment & Plan Note (Signed)
New RLQ mass.  History of R inguinal hernia with mass, bx 2020 as benign. No follow up after. Pt had CT w/ IV contrast 2021 to follow a left adrenal mass. Ordered CT abdomen and pelvis w/ PO and IV contrast to evaluate new palpable mass and follow left adrenal mass.

## 2021-04-14 ENCOUNTER — Ambulatory Visit
Admission: RE | Admit: 2021-04-14 | Discharge: 2021-04-14 | Disposition: A | Discharge status: Home / Self Care | Payer: BC Managed Care – PPO | Source: Ambulatory Visit | Attending: Physician Assistant | Admitting: Physician Assistant

## 2021-04-14 ENCOUNTER — Other Ambulatory Visit: Payer: Self-pay | Admitting: Physician Assistant

## 2021-04-14 ENCOUNTER — Other Ambulatory Visit: Payer: Self-pay

## 2021-04-14 DIAGNOSIS — R1903 Right lower quadrant abdominal swelling, mass and lump: Secondary | ICD-10-CM | POA: Diagnosis not present

## 2021-04-14 DIAGNOSIS — R1031 Right lower quadrant pain: Secondary | ICD-10-CM | POA: Insufficient documentation

## 2021-04-14 DIAGNOSIS — K4190 Unilateral femoral hernia, without obstruction or gangrene, not specified as recurrent: Secondary | ICD-10-CM

## 2021-04-14 MED ORDER — IOHEXOL 300 MG/ML  SOLN
100.0000 mL | Freq: Once | INTRAMUSCULAR | Status: AC | PRN
  Administered 2021-04-14: 100 mL via INTRAVENOUS

## 2021-04-25 ENCOUNTER — Other Ambulatory Visit: Payer: Self-pay

## 2021-04-25 ENCOUNTER — Encounter: Payer: Self-pay | Admitting: Family Medicine

## 2021-04-25 ENCOUNTER — Ambulatory Visit
Admission: RE | Admit: 2021-04-25 | Discharge: 2021-04-25 | Disposition: A | Discharge status: Home / Self Care | Payer: BC Managed Care – PPO | Source: Ambulatory Visit | Attending: Family Medicine | Admitting: Family Medicine

## 2021-04-25 ENCOUNTER — Other Ambulatory Visit: Payer: Self-pay | Admitting: Family Medicine

## 2021-04-25 ENCOUNTER — Ambulatory Visit: Payer: Self-pay

## 2021-04-25 ENCOUNTER — Ambulatory Visit: Payer: BC Managed Care – PPO | Admitting: Family Medicine

## 2021-04-25 VITALS — BP 123/79 | HR 98 | Temp 98.1°F | Resp 16 | Ht 60.0 in | Wt 258.0 lb

## 2021-04-25 DIAGNOSIS — M25562 Pain in left knee: Secondary | ICD-10-CM | POA: Insufficient documentation

## 2021-04-25 DIAGNOSIS — J0100 Acute maxillary sinusitis, unspecified: Secondary | ICD-10-CM | POA: Diagnosis not present

## 2021-04-25 DIAGNOSIS — Z299 Encounter for prophylactic measures, unspecified: Secondary | ICD-10-CM

## 2021-04-25 DIAGNOSIS — I82812 Embolism and thrombosis of superficial veins of left lower extremities: Secondary | ICD-10-CM

## 2021-04-25 HISTORY — DX: Embolism and thrombosis of superficial veins of left lower extremity: I82.812

## 2021-04-25 MED ORDER — AMOXICILLIN 500 MG PO CAPS: 1000.0000 mg | ORAL_CAPSULE | Freq: Two times a day (BID) | ORAL | 0 refills | Status: DC

## 2021-04-25 MED ORDER — RIVAROXABAN 10 MG PO TABS: 10.0000 mg | ORAL_TABLET | Freq: Every day | ORAL | 0 refills | Status: DC

## 2021-04-25 NOTE — Telephone Encounter (Signed)
Pt. Reports she started having pain behind her left knee 2 days ago. No swelling, some redness. 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 Appointment made for today per [place] in the practice.    Reason for Disposition  History of prior "blood clot" in leg or lungs (i.e., deep vein thrombosis, pulmonary embolism)  Answer Assessment - Initial Assessment Questions 1. ONSET: "When did the pain start?"      2 days ago 2. LOCATION: "Where is the pain located?"      Behind left knee 3. PAIN: "How bad is the pain?"    (Scale 1-10; or mild, moderate, severe)   -  MILD (1-3): doesn't interfere with normal activities    -  MODERATE (4-7): interferes with normal activities (e.g., work or school) or awakens from sleep, limping    -  SEVERE (8-10): excruciating pain, unable to do any normal activities, unable to walk     Worse - 6 4. WORK OR EXERCISE: "Has there been any recent work or exercise that involved this part of the body?"      No 5. CAUSE: "What do you think is causing the leg pain?"     Maybe a DVT 6. OTHER SYMPTOMS: "Do you have any other symptoms?" (e.g., chest pain, back pain, breathing difficulty, swelling, rash, fever, numbness, weakness)     Redness 7. PREGNANCY: "Is there any chance you are pregnant?" "When was your last menstrual period?"     No  Protocols used: Leg Pain-A-AH

## 2021-04-25 NOTE — Progress Notes (Signed)
      I,[name],acting as a scribe for Lelon Huh, MD.,have documented all relevant documentation on the behalf of Lelon Huh, MD,as directed by  Lelon Huh, MD while in the presence of Lelon Huh, MD.  Established patient visit   Patient: Amber Whitehead   DOB: 28-Nov-1980   40 y.o. Female  MRN: [id number] Visit Date: 04/25/2021  Today's healthcare provider: Lelon Huh, MD   Chief Complaint  Patient presents with   Knee Pain   Sinusitis   Subjective    Knee Pain  The incident occurred 3 to 5 days ago (4 days ago). There was no injury mechanism. The pain is present in the left knee. The quality of the pain is described as cramping. The pain is at a severity of 6/10. The pain is moderate. The pain has been Constant since onset. Associated symptoms include an inability to bear weight. Pertinent negatives include no loss of motion, loss of sensation, muscle weakness, numbness or tingling. She reports no foreign bodies present. The symptoms are aggravated by movement (seating or laying down). She has tried nothing for the symptoms.    Patient states she has had pain in the back of her left knee for 4 days. Pain is constant. Patient states pain is worse with movement, after long periods of seating still, or laying down. Pain is described as very tight. Patient states she has had a blood clot in her leg before and this feels similar. Patient has not been treating symptoms at home.   Patient also has had severe sinus pressure in her right side jaw and teeth. Also has sinus congestion and pressure. Patient has been taking ibuprofen to help with symptoms with no relief.    Medications: Outpatient Medications Prior to Visit  Medication Sig   sertraline (ZOLOFT) 50 MG tablet Take 50 mg by mouth at bedtime.    No facility-administered medications prior to visit.    Review of Systems  Neurological:  Negative for tingling and numbness.      Objective    BP 123/79  (BP Location: Left Arm, Patient Position: Sitting, Cuff Size: Large)   Pulse 98   Temp 98.1 F (36.7 C) (Oral)   Resp 16   Ht 5' (1.524 m)   Wt 258 lb (117 kg)   SpO2 100%   BMI 50.39 kg/m  {Show previous vital signs (optional):23777}  Physical Exam  Two tender nodules left lateral knee along superficial varicose vein tracks. Slightly inflamed.   No results found for any visits on 04/25/21.  Assessment & Plan     1. Acute pain of left knee  Seems to be along track of superficial veins of medial aspect of knee. Likely phlebitis. Considering history of DVT will rule out superficial thrombosis.  - US Venous Img Lower Unilateral Left (DVT); Future  2. Acute non-recurrent maxillary sinusitis  - amoxicillin (AMOXIL) 500 MG capsule; Take 2 capsules (1,000 mg total) by mouth 2 (two) times daily for 10 days.  Dispense: 40 capsule; Refill: 0         The entirety of the information documented in the History of Present Illness, Review of Systems and Physical Exam were personally obtained by me. Portions of this information were initially documented by the CMA and reviewed by me for thoroughness and accuracy.     Lelon Huh, MD  [facility] [phone] (phone) [phone] (fax)  [place]

## 2021-04-26 ENCOUNTER — Ambulatory Visit: Payer: Self-pay | Admitting: Surgery

## 2021-04-26 ENCOUNTER — Encounter: Payer: Self-pay | Admitting: Surgery

## 2021-04-26 ENCOUNTER — Ambulatory Visit (INDEPENDENT_AMBULATORY_CARE_PROVIDER_SITE_OTHER): Payer: BC Managed Care – PPO | Admitting: Surgery

## 2021-04-26 VITALS — BP 115/85 | HR 109 | Temp 98.0°F | Ht 60.0 in | Wt 256.0 lb

## 2021-04-26 DIAGNOSIS — I8002 Phlebitis and thrombophlebitis of superficial vessels of left lower extremity: Secondary | ICD-10-CM | POA: Diagnosis not present

## 2021-04-26 DIAGNOSIS — K4130 Unilateral femoral hernia, with obstruction, without gangrene, not specified as recurrent: Secondary | ICD-10-CM | POA: Insufficient documentation

## 2021-04-26 NOTE — Progress Notes (Signed)
Patient ID: Amber Whitehead, female   DOB: [birth year], 40 y.o.   MRN: [id number]  Chief Complaint: Right incarcerated femoral hernia.  History of Present Illness Amber Whitehead is a 40 y.o. female with known right femoral hernia present since 2020.  Originally noted during diagnosis of right DVT.  Minimally symptomatic, however provide significant discomfort when sitting for prolonged periods or when laying prone.  Over the last 3 weeks has been somewhat more symptomatic.  Yesterday she was diagnosed with superficial blood clot involving the left superficial saphenous system.  Past Medical History Past Medical History:  Diagnosis Date   Depression    History of premature delivery    of twins   Irritable bowel syndrome (IBS)    Pneumonia 10/2014   Seasonal allergies    Uterine fibroid       Past Surgical History:  Procedure Laterality Date   ROBOT ASSISTED MYOMECTOMY N/A 07/12/2018   Procedure: XI ROBOTIC ASSISTED LAPAROSCOPIC MYOMECTOMY;  Surgeon: Governor Specking, MD;  Location: [facility];  Service: Gynecology;  Laterality: N/A;   WISDOM TOOTH EXTRACTION      No Known Allergies  Current Outpatient Medications  Medication Sig Dispense Refill   amoxicillin (AMOXIL) 500 MG capsule Take 2 capsules (1,000 mg total) by mouth 2 (two) times daily for 10 days. 40 capsule 0   rivaroxaban (XARELTO) 10 MG TABS tablet Take 1 tablet (10 mg total) by mouth daily. 45 tablet 0   sertraline (ZOLOFT) 50 MG tablet Take 50 mg by mouth at bedtime.      No current facility-administered medications for this visit.    Family History Family History  Problem Relation Age of Onset   Healthy Mother    Healthy Father    Healthy Sister    Lung cancer Maternal Grandmother    Healthy Sister    Breast cancer Neg Hx    Cancer - Colon Neg Hx       Social History Social History   Tobacco Use   Smoking status: Never   Smokeless tobacco: Never  Vaping Use   Vaping Use:  Never used  Substance Use Topics   Alcohol use: No   Drug use: No        Review of Systems  Constitutional: Negative.   HENT:  Positive for sinus pain.   Eyes: Negative.   Cardiovascular:  Positive for leg swelling (Minimal pain, secondary to superficial thrombophlebitis of the left greater saphenous vein distal thigh proximal calf.  No evidence of DVT.).  Gastrointestinal: Negative.   Genitourinary: Negative.   Skin: Negative.   Neurological: Negative.   Psychiatric/Behavioral:  Positive for depression.      Physical Exam Blood pressure 115/85, pulse (!) 109, temperature 98 F (36.7 C), height 5' (1.524 m), weight 256 lb (116.1 kg), last menstrual period 03/14/2021, SpO2 98 %. Last Weight  Most recent update: 04/26/2021 11:23 AM    Weight  116.1 kg (256 lb)             CONSTITUTIONAL: Well developed, and nourished, appropriately responsive and aware without distress.  Morbidly obese. EYES: Sclera non-icteric.   EARS, NOSE, MOUTH AND THROAT: Mask worn.   Hearing is intact to voice.  NECK: Trachea is midline, and there is no jugular venous distension.  LYMPH NODES:  Lymph nodes in the neck are not enlarged. RESPIRATORY:  Lungs are clear, and breath sounds are equal bilaterally. Normal respiratory effort without pathologic use of accessory muscles. CARDIOVASCULAR: Heart is regular  in rate and rhythm. GI: The abdomen is  soft, nontender, and nondistended. There were no palpable masses. I did not appreciate hepatosplenomegaly. There were normal bowel sounds.  The right lower quadrant mass correlates to the known fluid-filled hernia sac of the right femoral hernia. MUSCULOSKELETAL:  Symmetrical muscle tone appreciated in all four extremities.    SKIN: Skin turgor is normal. No pathologic skin lesions appreciated.  NEUROLOGIC:  Motor and sensation appear grossly normal.  Cranial nerves are grossly without defect. PSYCH:  Alert and oriented to person, place and time. Affect is  appropriate for situation.  Data Reviewed I have personally reviewed what is currently available of the patient's imaging, recent labs and medical records.   Labs:  CBC Latest Ref Rng & Units 04/15/2020 01/01/2019 07/10/2018  WBC 3.4 - 10.8 x10E3/uL 5.7 5.5 6.2  Hemoglobin 11.1 - 15.9 g/dL 13.7 11.9(L) 12.2  Hematocrit 34.0 - 46.6 % 40.9 39.7 40.3  Platelets 150 - 450 x10E3/uL 297 330 332   CMP Latest Ref Rng & Units 04/15/2020 03/13/2019  Glucose 65 - 99 mg/dL 87 98  BUN 6 - 24 mg/dL 12 15  Creatinine 0.57 - 1.00 mg/dL 0.73 0.69  Sodium 134 - 144 mmol/L 140 141  Potassium 3.5 - 5.2 mmol/L 4.4 4.0  Chloride 96 - 106 mmol/L 106 105  CO2 20 - 29 mmol/L 21 23  Calcium 8.7 - 10.2 mg/dL 9.0 9.1  Total Protein 6.0 - 8.5 g/dL 6.5 6.5  Total Bilirubin 0.0 - 1.2 mg/dL 0.4 <0.2  Alkaline Phos 44 - 121 IU/L 69 61  AST 0 - 40 IU/L 13 13  ALT 0 - 32 IU/L 13 11      Imaging:  Within last 24 hrs: US Venous Img Lower Unilateral Left (DVT)  Result Date: 04/25/2021 CLINICAL DATA:  Left leg pain and redness. EXAM: LEFT LOWER EXTREMITY VENOUS DOPPLER ULTRASOUND TECHNIQUE: Gray-scale sonography with graded compression, as well as color Doppler and duplex ultrasound were performed to evaluate the lower extremity deep venous systems from the level of the common femoral vein and including the common femoral, femoral, profunda femoral, popliteal and calf veins including the posterior tibial, peroneal and gastrocnemius veins when visible. The superficial great saphenous vein was also interrogated. Spectral Doppler was utilized to evaluate flow at rest and with distal augmentation maneuvers in the common femoral, femoral and popliteal veins. COMPARISON:  CT pelvis 04/14/2021 FINDINGS: Contralateral Common Femoral Vein: Respiratory phasicity is normal and symmetric with the symptomatic side. No evidence of thrombus. Normal compressibility. Common Femoral Vein: No evidence of thrombus. Normal compressibility,  respiratory phasicity and response to augmentation. Saphenofemoral Junction: No evidence of thrombus. Normal compressibility and flow on color Doppler imaging. Profunda Femoral Vein: No evidence of thrombus. Normal compressibility and flow on color Doppler imaging. Femoral Vein: No evidence of thrombus. Normal compressibility, respiratory phasicity and response to augmentation. Popliteal Vein: No evidence of thrombus. Normal compressibility, respiratory phasicity and response to augmentation. Calf Veins: No evidence of thrombus. Normal compressibility and flow on color Doppler imaging. Superficial Great Saphenous Vein: Positive for thrombus. Distal thigh great saphenous vein is noncompressible with echogenic thrombus. This corresponds with the area of pain and redness. GSV is also thrombosed in the proximal calf. Other Findings: Large anechoic structure in the right groin corresponding with the cystic structure on previous CT. IMPRESSION: 1. Positive for superficial venous thrombosis in the left great saphenous vein (GSV). GSV thrombus involves the proximal left calf and the distal left thigh. 2.  Negative for deep venous thrombosis in left lower extremity. 3. Again noted is a large fluid or cystic collection in the right groin. Electronically Signed   By: Markus Daft M.D.   On: 04/25/2021 14:42    Assessment    Incarcerated right femoral hernia, known since 2020.  Prior history of DVT right femoral system. Current superficial venous thrombophlebitis, to initiate Xarelto. Patient Active Problem List   Diagnosis Date Noted   Right lower quadrant abdominal mass 04/12/2021   Uterine leiomyoma 11/06/2018   Menorrhagia 11/06/2018   Right groin mass 11/06/2018   History of deep vein thrombosis (DVT) of lower extremity 11/06/2018   MDD (major depressive disorder) 07/20/2009   Morbid obesity ([place]) 07/20/2009    Plan    Robotic repair of right incarcerated femoral hernia.   We discussed that she should  proceed with initiating her Xarelto, we will interrupt it for 2 full days prior to surgery and resume it immediately postop. We discussed robotic repair and its inherent risks including anesthesia, bleeding, infection, recurrence.  Concerns raised regarding adjacent prior history of DVT.  We discussed mesh placement in this area.  We reviewed the CT scans showing the hernia together.  I believe certain risks are inherent in this procedure and more than we would appreciate, however feel that optimal timing with other comorbid conditions are likely not going to get any better.  Face-to-face time spent with the patient and accompanying care providers(if present) was 45 minutes, with more than 50% of the time spent counseling, educating, and coordinating care of the patient.    These notes generated with voice recognition software. I apologize for typographical errors.  Ronny Bacon M.D., FACS 04/26/2021, 12:12 PM

## 2021-04-26 NOTE — Patient Instructions (Addendum)
You have chose to have your hernia repaired. This will be done by Dr. Christian Mate at [facility].  Please see your (blue) Pre-care information that you have been given today. Our surgery scheduler will call you to look at surgery dates and to go over information.   You will need to arrange to be out of work for 2 weeks and then return with a lifting restrictions for 4 more weeks. Please send any FMLA paperwork prior to surgery and we will fill this out and fax it back to your employer within 3 business days.  You may have a bruise in your groin and also swelling and brusing in your testicle area. You may use ice 4-5 times daily for 15-20 minutes each time. Make sure that you place a barrier between you and the ice pack. To decrease the swelling, you may roll up a bath towel and place it vertically in between your thighs with your testicles resting on the towel. You will want to keep this area elevated as much as possible for several days following surgery.  You will need to stop your Xarelto 2 days before surgery. If surgery is on Wednesday, do not take it that Monday or Tuesday prior.   Inguinal Hernia, Adult Muscles help keep everything in the body in its proper place. But if a weak spot in the muscles develops, something can poke through. That is called a hernia. When this happens in the lower part of the belly (abdomen), it is called an inguinal hernia. (It takes its name from a part of the body in this region called the inguinal canal.) A weak spot in the wall of muscles lets some fat or part of the small intestine bulge through. An inguinal hernia can develop at any age. Men get them more often than women. CAUSES  In adults, an inguinal hernia develops over time. It can be triggered by: Suddenly straining the muscles of the lower abdomen. Lifting heavy objects. Straining to have a bowel movement. Difficult bowel movements (constipation) can lead to this. Constant coughing. This may be caused by  smoking or lung disease. Being overweight. Being pregnant. Working at a job that requires long periods of standing or heavy lifting. Having had an inguinal hernia before. One type can be an emergency situation. It is called a strangulated inguinal hernia. It develops if part of the small intestine slips through the weak spot and cannot get back into the abdomen. The blood supply can be cut off. If that happens, part of the intestine may die. This situation requires emergency surgery. SYMPTOMS  Often, a small inguinal hernia has no symptoms. It is found when a healthcare provider does a physical exam. Larger hernias usually have symptoms.  In adults, symptoms may include: A lump in the groin. This is easier to see when the person is standing. It might disappear when lying down. In men, a lump in the scrotum. Pain or burning in the groin. This occurs especially when lifting, straining or coughing. A dull ache or feeling of pressure in the groin. Signs of a strangulated hernia can include: A bulge in the groin that becomes very painful and tender to the touch. A bulge that turns red or purple. Fever, nausea and vomiting. Inability to have a bowel movement or to pass gas. DIAGNOSIS  To decide if you have an inguinal hernia, a healthcare provider will probably do a physical examination. This will include asking questions about any symptoms you have noticed. The healthcare provider  might feel the groin area and ask you to cough. If an inguinal hernia is felt, the healthcare provider may try to slide it back into the abdomen. Usually no other tests are needed. TREATMENT  Treatments can vary. The size of the hernia makes a difference. Options include: Watchful waiting. This is often suggested if the hernia is small and you have had no symptoms. No medical procedure will be done unless symptoms develop. You will need to watch closely for symptoms. If any occur, contact your healthcare provider  right away. Surgery. This is used if the hernia is larger or you have symptoms. Open surgery. This is usually an outpatient procedure (you will not stay overnight in a hospital). An cut (incision) is made through the skin in the groin. The hernia is put back inside the abdomen. The weak area in the muscles is then repaired by herniorrhaphy or hernioplasty. Herniorrhaphy: in this type of surgery, the weak muscles are sewn back together. Hernioplasty: a patch or mesh is used to close the weak area in the abdominal wall. Laparoscopy. In this procedure, a surgeon makes small incisions. A thin tube with a tiny video camera (called a laparoscope) is put into the abdomen. The surgeon repairs the hernia with mesh by looking with the video camera and using two long instruments. HOME CARE INSTRUCTIONS  After surgery to repair an inguinal hernia: You will need to take pain medicine prescribed by your healthcare provider. Follow all directions carefully. You will need to take care of the wound from the incision. Your activity will be restricted for awhile. This will probably include no heavy lifting for several weeks. You also should not do anything too active for a few weeks. When you can return to work will depend on the type of job that you have. During "watchful waiting" periods, you should: Maintain a healthy weight. Eat a diet high in fiber (fruits, vegetables and whole grains). Drink plenty of fluids to avoid constipation. This means drinking enough water and other liquids to keep your urine clear or pale yellow. Do not lift heavy objects. Do not stand for long periods of time. Quit smoking. This should keep you from developing a frequent cough. SEEK MEDICAL CARE IF:  A bulge develops in your groin area. You feel pain, a burning sensation or pressure in the groin. This might be worse if you are lifting or straining. You develop a fever of more than 100.5 F (38.1 C). SEEK IMMEDIATE MEDICAL CARE IF:   Pain in the groin increases suddenly. A bulge in the groin gets bigger suddenly and does not go down. For men, there is sudden pain in the scrotum. Or, the size of the scrotum increases. A bulge in the groin area becomes red or purple and is painful to touch. You have nausea or vomiting that does not go away. You feel your heart beating much faster than normal. You cannot have a bowel movement or pass gas. You develop a fever of more than 102.0 F (38.9 C).   This information is not intended to replace advice given to you by your health care provider. Make sure you discuss any questions you have with your health care provider.   Document Released: 10/29/2008 Document Revised: 09/04/2011 Document Reviewed: 12/14/2014 Elsevier Interactive Patient Education [employer].

## 2021-04-27 ENCOUNTER — Telehealth: Payer: Self-pay | Admitting: Surgery

## 2021-04-27 NOTE — Telephone Encounter (Signed)
Outgoing call is made, left message for patient to call.  Please inform patient of Pre-Admission date/time, COVID Testing date and Surgery date.  Surgery Date: 05/11/21 Preadmission Testing Date: 05/04/21 (phone 1p-5p) Covid Testing Date: Not needed.     Also patient will need to call at [phone], between 1-3:00pm the day before surgery, to find out what time to arrive for surgery.

## 2021-04-27 NOTE — Telephone Encounter (Signed)
Patient calls back, she is informed of all dates regarding her surgery and verbalized understanding. 

## 2021-04-28 ENCOUNTER — Other Ambulatory Visit: Payer: Self-pay

## 2021-04-28 ENCOUNTER — Ambulatory Visit (INDEPENDENT_AMBULATORY_CARE_PROVIDER_SITE_OTHER): Payer: BC Managed Care – PPO | Admitting: Family Medicine

## 2021-04-28 ENCOUNTER — Encounter: Payer: Self-pay | Admitting: Family Medicine

## 2021-04-28 VITALS — BP 138/87 | HR 97 | Temp 97.7°F | Resp 16 | Ht 60.0 in | Wt 258.9 lb

## 2021-04-28 DIAGNOSIS — Z0000 Encounter for general adult medical examination without abnormal findings: Secondary | ICD-10-CM

## 2021-04-28 DIAGNOSIS — Z6841 Body Mass Index (BMI) 40.0 and over, adult: Secondary | ICD-10-CM

## 2021-04-28 DIAGNOSIS — F3341 Major depressive disorder, recurrent, in partial remission: Secondary | ICD-10-CM

## 2021-04-28 DIAGNOSIS — Z86718 Personal history of other venous thrombosis and embolism: Secondary | ICD-10-CM

## 2021-04-28 DIAGNOSIS — I82812 Embolism and thrombosis of superficial veins of left lower extremities: Secondary | ICD-10-CM | POA: Insufficient documentation

## 2021-04-28 DIAGNOSIS — Z23 Encounter for immunization: Secondary | ICD-10-CM | POA: Diagnosis not present

## 2021-04-28 DIAGNOSIS — E279 Disorder of adrenal gland, unspecified: Secondary | ICD-10-CM | POA: Insufficient documentation

## 2021-04-28 DIAGNOSIS — E278 Other specified disorders of adrenal gland: Secondary | ICD-10-CM

## 2021-04-28 NOTE — Assessment & Plan Note (Signed)
Was stable on recent CT scan

## 2021-04-28 NOTE — Progress Notes (Signed)
Complete physical exam   Patient: Amber Whitehead   DOB: [birth year]   40 y.o. Female  MRN: [id number] Visit Date: 04/28/2021  Today's healthcare provider: Lavon Paganini, MD   Chief Complaint  Patient presents with   Annual Exam   Subjective    Amber Whitehead is a 40 y.o. female who presents today for a complete physical exam.  She reports consuming a general diet. The patient does not participate in regular exercise at present. She generally feels fairly well. She reports sleeping fairly well. She does have additional problems to discuss today.  HPI  10/18/17 Pap; patient scheduled for 05/25/21  Was seen on 04/25/2021 by Dr. Caryn Section for acute left posterior knee pain and swelling.  Venous ultrasound showed superficial venous thrombosis in the left great saphenous vein that involves the proximal left calf and distal left thigh.  Negative for DVT in the left lower extremity.  Given patient's history of DVT, length of her superficial venous thrombosis, and location in the GSV, she was recommended to start prophylactic Xarelto.  She has been on this for 2 days and is tolerating it well.  She will need to hold it for her upcoming hernia surgery.  She thinks she may have been told about some pro clotting factor that she had when she was worked up after delivering 24-week twins in the past.   Past Medical History:  Diagnosis Date   Depression    History of premature delivery    of twins   Irritable bowel syndrome (IBS)    Pneumonia 10/2014   Seasonal allergies    Uterine fibroid    Past Surgical History:  Procedure Laterality Date   ROBOT ASSISTED MYOMECTOMY N/A 07/12/2018   Procedure: XI ROBOTIC ASSISTED LAPAROSCOPIC MYOMECTOMY;  Surgeon: Governor Specking, MD;  Location: [facility];  Service: Gynecology;  Laterality: N/A;   WISDOM TOOTH EXTRACTION     Social History   Socioeconomic History   Marital status: Married    Spouse name: Not on file    Number of children: Not on file   Years of education: Not on file   Highest education level: Not on file  Occupational History   Not on file  Tobacco Use   Smoking status: Never   Smokeless tobacco: Never  Vaping Use   Vaping Use: Never used  Substance and Sexual Activity   Alcohol use: No   Drug use: No   Sexual activity: Not on file  Other Topics Concern   Not on file  Social History Narrative   Not on file   Social Determinants of Health   Financial Resource Strain: Not on file  Food Insecurity: Not on file  Transportation Needs: Not on file  Physical Activity: Not on file  Stress: Not on file  Social Connections: Not on file  Intimate Partner Violence: Not on file   Family Status  Relation Name Status   Mother  Alive   Father  Alive   Sister  Alive   MGM  Alive   Sister  Alive   Neg Hx  (Not Specified)   Family History  Problem Relation Age of Onset   Healthy Mother    Healthy Father    Healthy Sister    Lung cancer Maternal Grandmother    Healthy Sister    Breast cancer Neg Hx    Cancer - Colon Neg Hx    No Known Allergies  Patient Care Team: Virginia Crews,  MD as PCP - General (Family Medicine)   Medications: Outpatient Medications Prior to Visit  Medication Sig   amoxicillin (AMOXIL) 500 MG capsule Take 2 capsules (1,000 mg total) by mouth 2 (two) times daily for 10 days.   rivaroxaban (XARELTO) 10 MG TABS tablet Take 1 tablet (10 mg total) by mouth daily.   sertraline (ZOLOFT) 50 MG tablet Take 50 mg by mouth at bedtime.    No facility-administered medications prior to visit.    Review of Systems  Constitutional: Negative.   HENT:  Positive for dental problem and sinus pressure.   Eyes: Negative.   Respiratory: Negative.    Cardiovascular:  Positive for leg swelling.  Gastrointestinal: Negative.   Endocrine: Negative.   Genitourinary: Negative.   Musculoskeletal: Negative.   Skin: Negative.   Allergic/Immunologic: Negative.    Neurological: Negative.   Hematological: Negative.   Psychiatric/Behavioral: Negative.       Objective    BP 138/87 (BP Location: Left Arm, Patient Position: Sitting, Cuff Size: Large)   Pulse 97   Temp 97.7 F (36.5 C) (Temporal)   Resp 16   Ht 5' (1.524 m)   Wt 258 lb 14.4 oz (117.4 kg)   LMP 03/14/2021 (Exact Date)   SpO2 100%   BMI 50.56 kg/m    Physical Exam Vitals reviewed.  Constitutional:      General: She is not in acute distress.    Appearance: Normal appearance. She is well-developed. She is not diaphoretic.  HENT:     Head: Normocephalic and atraumatic.     Right Ear: Tympanic membrane, ear canal and external ear normal.     Left Ear: Tympanic membrane, ear canal and external ear normal.     Nose: Nose normal.     Mouth/Throat:     Mouth: Mucous membranes are moist.     Pharynx: Oropharynx is clear. No oropharyngeal exudate.  Eyes:     General: No scleral icterus.    Conjunctiva/sclera: Conjunctivae normal.     Pupils: Pupils are equal, round, and reactive to light.  Neck:     Thyroid: No thyromegaly.  Cardiovascular:     Rate and Rhythm: Normal rate and regular rhythm.     Pulses: Normal pulses.     Heart sounds: Normal heart sounds. No murmur heard. Pulmonary:     Effort: Pulmonary effort is normal. No respiratory distress.     Breath sounds: Normal breath sounds. No wheezing or rales.  Abdominal:     General: There is no distension.     Palpations: Abdomen is soft.     Tenderness: There is no abdominal tenderness.  Musculoskeletal:        General: No deformity.     Cervical back: Neck supple.     Right lower leg: No edema.     Left lower leg: Edema present.  Lymphadenopathy:     Cervical: No cervical adenopathy.  Skin:    General: Skin is warm and dry.     Findings: No rash.  Neurological:     Mental Status: She is alert and oriented to person, place, and time. Mental status is at baseline.     Sensory: No sensory deficit.     Motor: No  weakness.     Gait: Gait normal.  Psychiatric:        Mood and Affect: Mood normal.        Behavior: Behavior normal.        Thought Content: Thought content normal.  Last depression screening scores PHQ 2/9 Scores 04/28/2021 04/12/2021 04/06/2020  PHQ - 2 Score 1 0 2  PHQ- 9 Score 3 4 6    Last fall risk screening Fall Risk  04/28/2021  Falls in the past year? 0  Number falls in past yr: 0  Injury with Fall? 0  Risk for fall due to : No Fall Risks  Follow up Falls evaluation completed   Last Audit-C alcohol use screening Alcohol Use Disorder Test (AUDIT) 04/28/2021  1. How often do you have a drink containing alcohol? 0  2. How many drinks containing alcohol do you have on a typical day when you are drinking? 0  3. How often do you have six or more drinks on one occasion? 0  AUDIT-C Score 0  Alcohol Brief Interventions/Follow-up -   A score of 3 or more in women, and 4 or more in men indicates increased risk for alcohol abuse, EXCEPT if all of the points are from question 1   No results found for any visits on 04/28/21.  Assessment & Plan    Routine Health Maintenance and Physical Exam  Exercise Activities and Dietary recommendations  Goals   None     Immunization History  Administered Date(s) Administered   DTaP 06/25/1979, 08/27/1979, 10/29/1979, 11/13/1980, 12/10/1984   Hepatitis A 04/08/2002   Hepatitis B 07/02/1998, 04/08/2002   IPV 06/25/1979, 08/27/1979, 10/29/1979, 11/13/1980   Influenza Inj Mdck Quad Pf 04/01/2018   Influenza,inj,Quad PF,6+ Mos 03/13/2019, 04/06/2020, 04/28/2021   Meningococcal Conjugate 11/11/1985   PFIZER(Purple Top)SARS-COV-2 Vaccination 09/04/2019, 10/02/2019, 04/15/2020   Td 07/16/1997   Tdap 03/13/2019    Health Maintenance  Topic Date Due   Pneumococcal Vaccine 56-70 Years old (1 - PCV) Never done   COVID-19 Vaccine (4 - Booster for Pfizer series) 06/10/2020   PAP SMEAR-Modifier  10/18/2020   TETANUS/TDAP  03/12/2029    INFLUENZA VACCINE  Completed   Hepatitis C Screening  Completed   HIV Screening  Completed   HPV VACCINES  Aged Out    Discussed health benefits of physical activity, and encouraged her to engage in regular exercise appropriate for her age and condition.  Problem List Items Addressed This Visit       Cardiovascular and Mediastinum   Acute superficial venous thrombosis of lower extremity, left    New diagnosis Continue prophylactic Xarelto for 45 days and then repeat ultrasound, D-dimer, and clinical exam to determine if she needs to continue on Xarelto Discussed that given her history of DVT that was thought to be provoked by surgery at that time and now with new unprovoked superficial venous thrombosis, she does warrant a hypercoagulable work-up Referral to hematology placed today      Relevant Orders   US Venous Img Lower Unilateral Left     Other   MDD (major depressive disorder)    Chronic and well-controlled Continue Zoloft 50 mg nightly Encourage therapy Continue to monitor PHQ-9      Morbid obesity ([place])    Discussed importance of healthy weight management Discussed diet and exercise       Relevant Orders   CBC   Lipid panel   Comprehensive metabolic panel   History of deep vein thrombosis (DVT) of lower extremity   Relevant Orders   Ambulatory referral to Hematology / Oncology   Adrenal nodule ([place])    Was stable on recent CT scan      Other Visit Diagnoses     Encounter for annual physical exam    -  Primary   Relevant Orders   CBC   Lipid panel   Comprehensive metabolic panel   BMI 04.1-36.4, adult ([place])       Need for influenza vaccination       Relevant Orders   Flu Vaccine QUAD 56mo+IM (Fluarix, Fluzone & Alfiuria Quad PF) (Completed)        Return in about 6 weeks (around 06/09/2021) for blood clot f/u.     I, Lavon Paganini, MD, have reviewed all documentation for this visit. The documentation on 04/28/21 for the exam, diagnosis,  procedures, and orders are all accurate and complete.   Patty Lopezgarcia, Dionne Bucy, MD, MPH [facility]

## 2021-04-28 NOTE — Assessment & Plan Note (Signed)
Chronic and well-controlled Continue Zoloft 50 mg nightly Encourage therapy Continue to monitor PHQ-9

## 2021-04-28 NOTE — Assessment & Plan Note (Signed)
Discussed importance of healthy weight management Discussed diet and exercise  

## 2021-04-28 NOTE — Assessment & Plan Note (Signed)
New diagnosis Continue prophylactic Xarelto for 45 days and then repeat ultrasound, D-dimer, and clinical exam to determine if she needs to continue on Xarelto Discussed that given her history of DVT that was thought to be provoked by surgery at that time and now with new unprovoked superficial venous thrombosis, she does warrant a hypercoagulable work-up Referral to hematology placed today

## 2021-05-04 ENCOUNTER — Other Ambulatory Visit: Payer: Self-pay

## 2021-05-04 ENCOUNTER — Encounter: Payer: Self-pay | Admitting: Urgent Care

## 2021-05-04 ENCOUNTER — Encounter
Admission: RE | Admit: 2021-05-04 | Discharge: 2021-05-04 | Disposition: A | Discharge status: Home / Self Care | Payer: BC Managed Care – PPO | Source: Ambulatory Visit | Attending: Surgery | Admitting: Surgery

## 2021-05-04 DIAGNOSIS — K4130 Unilateral femoral hernia, with obstruction, without gangrene, not specified as recurrent: Secondary | ICD-10-CM

## 2021-05-04 LAB — LIPID PANEL
Chol/HDL Ratio: 3 ratio (ref 0.0–4.4)
Cholesterol, Total: 166 mg/dL (ref 100–199)
HDL: 56 mg/dL (ref >39)
LDL Chol Calc (NIH): 87 mg/dL (ref 0–99)
Triglycerides: 130 mg/dL (ref 0–149)
VLDL Cholesterol Cal: 23 mg/dL (ref 5–40)

## 2021-05-04 LAB — COMPREHENSIVE METABOLIC PANEL
ALT: 12 IU/L (ref 0–32)
AST: 12 IU/L (ref 0–40)
Albumin/Globulin Ratio: 1.5 (ref 1.2–2.2)
Albumin: 3.7 g/dL — ABNORMAL LOW (ref 3.8–4.8)
Alkaline Phosphatase: 62 IU/L (ref 44–121)
BUN/Creatinine Ratio: 14 (ref 9–23)
BUN: 9 mg/dL (ref 6–24)
Bilirubin Total: 0.3 mg/dL (ref 0.0–1.2)
CO2: 20 mmol/L (ref 20–29)
Calcium: 8.9 mg/dL (ref 8.7–10.2)
Chloride: 101 mmol/L (ref 96–106)
Creatinine, Ser: 0.66 mg/dL (ref 0.57–1.00)
Globulin, Total: 2.4 g/dL (ref 1.5–4.5)
Glucose: 89 mg/dL (ref 70–99)
Potassium: 4.2 mmol/L (ref 3.5–5.2)
Sodium: 135 mmol/L (ref 134–144)
Total Protein: 6.1 g/dL (ref 6.0–8.5)
eGFR: 112 mL/min/{1.73_m2} (ref >59)

## 2021-05-04 LAB — CBC
Hematocrit: 37 % (ref 34.0–46.6)
Hemoglobin: 11.9 g/dL (ref 11.1–15.9)
MCH: 29.5 pg (ref 26.6–33.0)
MCHC: 32.2 g/dL (ref 31.5–35.7)
MCV: 92 fL (ref 79–97)
Platelets: 329 10*3/uL (ref 150–450)
RBC: 4.03 x10E6/uL (ref 3.77–5.28)
RDW: 12.9 % (ref 11.7–15.4)
WBC: 6.6 10*3/uL (ref 3.4–10.8)

## 2021-05-04 NOTE — Patient Instructions (Addendum)
Your procedure is scheduled on: Wednesday, November 16 Report to the Registration Desk on the 1st floor of the [facility]. To find out your arrival time, please call [phone] between 1PM - 3PM on: Tuesday, November 15  REMEMBER: Instructions that are not followed completely may result in serious medical risk, up to and including death; or upon the discretion of your surgeon and anesthesiologist your surgery may need to be rescheduled.  Do not eat food after midnight the night before surgery.  No gum chewing, lozengers or hard candies.  You may however, drink CLEAR liquids up to 2 hours before you are scheduled to arrive for your surgery. Do not drink anything within 2 hours of your scheduled arrival time.  Clear liquids include: - water  - apple juice without pulp - gatorade (not RED, PURPLE, OR BLUE) - black coffee or tea (Do NOT add milk or creamers to the coffee or tea) Do NOT drink anything that is not on this list.  DO NOT TAKE ANY MEDICATIONS THE MORNING OF SURGERY   Follow recommendations from Cardiologist, Pulmonologist or PCP regarding stopping Xarelto. According to Dr. Forest Becker note. Hold Xarelto for 2 full days prior to surgery.Marland Kitchen last day to take Xarelto is Sunday, November 13.  Resume AFTER surgery per surgeon instruction.  One week prior to surgery: STARTING November 9 Stop Anti-inflammatories (NSAIDS) such as Advil, Aleve, Ibuprofen, Motrin, Naproxen, Naprosyn and Aspirin based products such as Excedrin, Goodys Powder, BC Powder. Stop ANY OVER THE COUNTER supplements until after surgery. You may however, continue to take Tylenol if needed for pain up until the day of surgery.  No Alcohol for 24 hours before or after surgery.  No Smoking including e-cigarettes for 24 hours prior to surgery.  No chewable tobacco products for at least 6 hours prior to surgery.  No nicotine patches on the day of surgery.  Do not use any "recreational" drugs for at least a week  prior to your surgery.  Please be advised that the combination of cocaine and anesthesia may have negative outcomes, up to and including death. If you test positive for cocaine, your surgery will be cancelled.  On the morning of surgery brush your teeth with toothpaste and water, you may rinse your mouth with mouthwash if you wish. Do not swallow any toothpaste or mouthwash.  Use CHG Soap as directed on instruction sheet.  Do not wear jewelry, make-up, hairpins, clips or nail polish.  Do not wear lotions, powders, or perfumes.   Do not shave body from the neck down 48 hours prior to surgery just in case you cut yourself which could leave a site for infection.  Also, freshly shaved skin may become irritated if using the CHG soap.  Contact lenses, hearing aids and dentures may not be worn into surgery.  Do not bring valuables to the hospital. [facility] is not responsible for any missing/lost belongings or valuables.   Notify your doctor if there is any change in your medical condition (cold, fever, infection).  Wear comfortable clothing (specific to your surgery type) to the hospital.  After surgery, you can help prevent lung complications by doing breathing exercises.  Take deep breaths and cough every 1-2 hours. Your doctor may order a device called an Incentive Spirometer to help you take deep breaths. When coughing or sneezing, hold a pillow firmly against your incision with both hands. This is called "splinting." Doing this helps protect your incision. It also decreases belly discomfort.  If you  are being discharged the day of surgery, you will not be allowed to drive home. You will need a responsible adult (18 years or older) to drive you home and stay with you that night.   If you are taking public transportation, you will need to have a responsible adult (18 years or older) with you. Please confirm with your physician that it is acceptable to use public transportation.    Please call the [facility] at [phone] if you have any questions about these instructions.  Surgery Visitation Policy:  Patients undergoing a surgery or procedure may have one family member or support person with them as long as that person is not COVID-19 positive or experiencing its symptoms.  That person may remain in the waiting area during the procedure and may rotate out with other people.

## 2021-05-09 ENCOUNTER — Encounter: Payer: Self-pay | Admitting: Oncology

## 2021-05-09 ENCOUNTER — Inpatient Hospital Stay: Payer: BC Managed Care – PPO

## 2021-05-09 ENCOUNTER — Inpatient Hospital Stay: Payer: BC Managed Care – PPO | Attending: Oncology | Admitting: Oncology

## 2021-05-09 ENCOUNTER — Other Ambulatory Visit: Payer: Self-pay

## 2021-05-09 VITALS — BP 129/83 | HR 96 | Temp 96.8°F | Wt 260.0 lb

## 2021-05-09 DIAGNOSIS — D259 Leiomyoma of uterus, unspecified: Secondary | ICD-10-CM | POA: Diagnosis not present

## 2021-05-09 DIAGNOSIS — I8002 Phlebitis and thrombophlebitis of superficial vessels of left lower extremity: Secondary | ICD-10-CM | POA: Insufficient documentation

## 2021-05-09 DIAGNOSIS — I471 Supraventricular tachycardia: Secondary | ICD-10-CM | POA: Diagnosis not present

## 2021-05-09 DIAGNOSIS — Z3A00 Weeks of gestation of pregnancy not specified: Secondary | ICD-10-CM | POA: Diagnosis not present

## 2021-05-09 DIAGNOSIS — F32A Depression, unspecified: Secondary | ICD-10-CM | POA: Diagnosis not present

## 2021-05-09 DIAGNOSIS — Z86718 Personal history of other venous thrombosis and embolism: Secondary | ICD-10-CM | POA: Insufficient documentation

## 2021-05-09 DIAGNOSIS — O2221 Superficial thrombophlebitis in pregnancy, first trimester: Secondary | ICD-10-CM | POA: Diagnosis present

## 2021-05-09 DIAGNOSIS — D3500 Benign neoplasm of unspecified adrenal gland: Secondary | ICD-10-CM | POA: Insufficient documentation

## 2021-05-09 DIAGNOSIS — M79605 Pain in left leg: Secondary | ICD-10-CM | POA: Diagnosis not present

## 2021-05-09 DIAGNOSIS — Z3491 Encounter for supervision of normal pregnancy, unspecified, first trimester: Secondary | ICD-10-CM

## 2021-05-11 ENCOUNTER — Encounter
Admission: RE | Disposition: A | Discharge status: Home / Self Care | Payer: Self-pay | Source: Home / Self Care | Attending: Surgery

## 2021-05-11 ENCOUNTER — Encounter: Payer: Self-pay | Admitting: Family Medicine

## 2021-05-11 ENCOUNTER — Ambulatory Visit
Admission: RE | Admit: 2021-05-11 | Discharge: 2021-05-11 | Disposition: A | Discharge status: Home / Self Care | Payer: BC Managed Care – PPO | Attending: Surgery | Admitting: Surgery

## 2021-05-11 DIAGNOSIS — Z331 Pregnant state, incidental: Secondary | ICD-10-CM | POA: Insufficient documentation

## 2021-05-11 DIAGNOSIS — Z538 Procedure and treatment not carried out for other reasons: Secondary | ICD-10-CM | POA: Insufficient documentation

## 2021-05-11 DIAGNOSIS — K4130 Unilateral femoral hernia, with obstruction, without gangrene, not specified as recurrent: Secondary | ICD-10-CM | POA: Insufficient documentation

## 2021-05-11 LAB — ANTIPHOSPHOLIPID SYNDROME PROF
Anticardiolipin IgG: <9 GPL U/mL (ref 0–14)
Anticardiolipin IgM: <9 MPL U/mL (ref 0–12)
DRVVT: 34.1 s (ref 0.0–47.0)
PTT Lupus Anticoagulant: 29.8 s (ref 0.0–51.9)

## 2021-05-11 LAB — PROTEIN S, TOTAL AND FREE
Protein S Ag, Free: 76 % (ref 61–136)
Protein S Ag, Total: 89 % (ref 60–150)

## 2021-05-11 LAB — POCT PREGNANCY, URINE: Preg Test, Ur: POSITIVE — AB

## 2021-05-11 LAB — PROTEIN C ACTIVITY: Protein C Activity: 127 % (ref 73–180)

## 2021-05-11 SURGERY — HERNIORRHAPHY, INGUINAL, ROBOT-ASSISTED, LAPAROSCOPIC
Anesthesia: General | Laterality: Right

## 2021-05-11 MED ORDER — FAMOTIDINE 20 MG PO TABS
ORAL_TABLET | ORAL | Status: AC
  Filled 2021-05-11: qty 1

## 2021-05-11 MED ORDER — MIDAZOLAM HCL 2 MG/2ML IJ SOLN
INTRAMUSCULAR | Status: AC
  Filled 2021-05-11: qty 2

## 2021-05-11 MED ORDER — FENTANYL CITRATE (PF) 100 MCG/2ML IJ SOLN
INTRAMUSCULAR | Status: AC
  Filled 2021-05-11: qty 2

## 2021-05-11 MED ORDER — CEFAZOLIN SODIUM-DEXTROSE 2-4 GM/100ML-% IV SOLN: 2.0000 g | INTRAVENOUS | Status: DC

## 2021-05-11 MED ORDER — CHLORHEXIDINE GLUCONATE CLOTH 2 % EX PADS: 6.0000 | MEDICATED_PAD | Freq: Once | CUTANEOUS | Status: DC

## 2021-05-11 MED ORDER — ORAL CARE MOUTH RINSE: 15.0000 mL | Freq: Once | OROMUCOSAL | Status: DC

## 2021-05-11 MED ORDER — CHLORHEXIDINE GLUCONATE 0.12 % MT SOLN
OROMUCOSAL | Status: AC
  Filled 2021-05-11: qty 15

## 2021-05-11 MED ORDER — GABAPENTIN 300 MG PO CAPS
ORAL_CAPSULE | ORAL | Status: AC
  Filled 2021-05-11: qty 1

## 2021-05-11 MED ORDER — PHENYLEPHRINE HCL-NACL 20-0.9 MG/250ML-% IV SOLN
INTRAVENOUS | Status: AC
  Filled 2021-05-11: qty 250

## 2021-05-11 MED ORDER — CEFAZOLIN SODIUM-DEXTROSE 2-4 GM/100ML-% IV SOLN
INTRAVENOUS | Status: AC
  Filled 2021-05-11: qty 100

## 2021-05-11 MED ORDER — PHENYLEPHRINE HCL (PRESSORS) 10 MG/ML IV SOLN
INTRAVENOUS | Status: AC
  Filled 2021-05-11: qty 1

## 2021-05-11 MED ORDER — CELECOXIB 200 MG PO CAPS
ORAL_CAPSULE | ORAL | Status: AC
  Filled 2021-05-11: qty 1

## 2021-05-11 MED ORDER — BUPIVACAINE LIPOSOME 1.3 % IJ SUSP: 20.0000 mL | Freq: Once | INTRAMUSCULAR | Status: DC

## 2021-05-11 MED ORDER — CHLORHEXIDINE GLUCONATE 0.12 % MT SOLN: 15.0000 mL | Freq: Once | OROMUCOSAL | Status: DC

## 2021-05-11 MED ORDER — FAMOTIDINE 20 MG PO TABS: 20.0000 mg | ORAL_TABLET | Freq: Once | ORAL | Status: DC

## 2021-05-11 MED ORDER — ACETAMINOPHEN 500 MG PO TABS
ORAL_TABLET | ORAL | Status: AC
  Filled 2021-05-11: qty 2

## 2021-05-11 MED ORDER — LACTATED RINGERS IV SOLN: INTRAVENOUS | Status: DC

## 2021-05-11 MED ORDER — GABAPENTIN 300 MG PO CAPS: 300.0000 mg | ORAL_CAPSULE | ORAL | Status: DC

## 2021-05-11 MED ORDER — ACETAMINOPHEN 500 MG PO TABS: 1000.0000 mg | ORAL_TABLET | ORAL | Status: DC

## 2021-05-11 MED ORDER — CELECOXIB 200 MG PO CAPS: 200.0000 mg | ORAL_CAPSULE | ORAL | Status: DC

## 2021-05-11 MED ORDER — PROPOFOL 10 MG/ML IV BOLUS
INTRAVENOUS | Status: AC
  Filled 2021-05-11: qty 40

## 2021-05-11 SURGICAL SUPPLY — 45 items
BLADE CLIPPER SURG (BLADE) ×2 IMPLANT
CANNULA CAP OBTURATR AIRSEAL 8 (CAP) ×2 IMPLANT
CHLORAPREP W/TINT 26 (MISCELLANEOUS) ×2 IMPLANT
COVER TIP SHEARS 8 DVNC (MISCELLANEOUS) ×1 IMPLANT
COVER TIP SHEARS 8MM DA VINCI (MISCELLANEOUS) ×1
COVER WAND RF STERILE (DRAPES) ×2 IMPLANT
DEFOGGER SCOPE WARMER CLEARIFY (MISCELLANEOUS) ×2 IMPLANT
DERMABOND ADVANCED (GAUZE/BANDAGES/DRESSINGS) ×1
DERMABOND ADVANCED .7 DNX12 (GAUZE/BANDAGES/DRESSINGS) ×1 IMPLANT
DRAPE 3/4 80X56 (DRAPES) ×2 IMPLANT
DRAPE ARM DVNC X/XI (DISPOSABLE) ×3 IMPLANT
DRAPE COLUMN DVNC XI (DISPOSABLE) ×1 IMPLANT
DRAPE DA VINCI XI ARM (DISPOSABLE) ×3
DRAPE DA VINCI XI COLUMN (DISPOSABLE) ×1
ELECT REM PT RETURN 9FT ADLT (ELECTROSURGICAL) ×2
ELECTRODE REM PT RTRN 9FT ADLT (ELECTROSURGICAL) ×1 IMPLANT
GAUZE 4X4 16PLY ~~LOC~~+RFID DBL (SPONGE) ×2 IMPLANT
GLOVE SURG ORTHO LTX SZ7.5 (GLOVE) ×6 IMPLANT
GOWN STRL REUS W/ TWL LRG LVL3 (GOWN DISPOSABLE) ×3 IMPLANT
GOWN STRL REUS W/TWL LRG LVL3 (GOWN DISPOSABLE) ×3
GRASPER SUT TROCAR 14GX15 (MISCELLANEOUS) IMPLANT
IRRIGATION STRYKERFLOW (MISCELLANEOUS) IMPLANT
IRRIGATOR STRYKERFLOW (MISCELLANEOUS)
IV CATH ANGIO 14GX1.88 NO SAFE (IV SOLUTION) ×2 IMPLANT
IV NS 1000ML (IV SOLUTION)
IV NS 1000ML BAXH (IV SOLUTION) IMPLANT
KIT PINK PAD W/HEAD ARE REST (MISCELLANEOUS) ×2
KIT PINK PAD W/HEAD ARM REST (MISCELLANEOUS) ×1 IMPLANT
LABEL OR SOLS (LABEL) ×2 IMPLANT
MANIFOLD NEPTUNE II (INSTRUMENTS) ×2 IMPLANT
NEEDLE HYPO 22GX1.5 SAFETY (NEEDLE) ×2 IMPLANT
NEEDLE INSUFFLATION 14GA 120MM (NEEDLE) IMPLANT
PACK LAP CHOLECYSTECTOMY (MISCELLANEOUS) ×2 IMPLANT
SEAL CANN UNIV 5-8 DVNC XI (MISCELLANEOUS) ×2 IMPLANT
SEAL XI 5MM-8MM UNIVERSAL (MISCELLANEOUS) ×2
SET TUBE FILTERED XL AIRSEAL (SET/KITS/TRAYS/PACK) ×2 IMPLANT
SOLUTION ELECTROLUBE (MISCELLANEOUS) ×2 IMPLANT
SUT MNCRL 4-0 (SUTURE) ×1
SUT MNCRL 4-0 27XMFL (SUTURE) ×1
SUT VIC AB 0 CT2 27 (SUTURE) ×2 IMPLANT
SUT VLOC 90 2/L VL 12 GS22 (SUTURE) IMPLANT
SUT VLOC 90 S/L VL9 GS22 (SUTURE) ×2 IMPLANT
SUTURE MNCRL 4-0 27XMF (SUTURE) ×1 IMPLANT
TROCAR Z-THREAD FIOS 11X100 BL (TROCAR) IMPLANT
WATER STERILE IRR 500ML POUR (IV SOLUTION) ×2 IMPLANT

## 2021-05-11 NOTE — Progress Notes (Signed)
Pt arrived to [facility], pregnancy positive. Pt and husband communicated the results. Dr Christian Mate notified and surgery cancelled.

## 2021-05-13 ENCOUNTER — Encounter: Payer: Self-pay | Admitting: Oncology

## 2021-05-13 ENCOUNTER — Telehealth: Payer: Self-pay

## 2021-05-13 ENCOUNTER — Other Ambulatory Visit (HOSPITAL_COMMUNITY): Payer: Self-pay

## 2021-05-13 ENCOUNTER — Other Ambulatory Visit: Payer: Self-pay | Admitting: Oncology

## 2021-05-13 MED ORDER — ENOXAPARIN SODIUM 120 MG/0.8ML IJ SOSY
120.0000 mg | PREFILLED_SYRINGE | Freq: Two times a day (BID) | INTRAMUSCULAR | 0 refills | Status: DC
Start: 2021-05-13 — End: 2022-05-02

## 2021-05-13 MED ORDER — ENOXAPARIN SODIUM 120 MG/0.8ML IJ SOSY: 120.0000 mg | PREFILLED_SYRINGE | INTRAMUSCULAR | 1 refills | Status: DC

## 2021-05-13 NOTE — Telephone Encounter (Signed)
Per Dr. Tasia Catchings, she recommends her to switch to Lovenox 1mg / kg and she has sent prescription. I contacted pt to let her know and she voiced understanding. She stated that OB had sent prescription as well. Asked her to start today if she has not taken xarelto yet. She said she has not taken Xarelto since sunday. She also stated that she will go on to the lovenox website and look at administration video.

## 2021-05-13 NOTE — Telephone Encounter (Signed)
Please advise 

## 2021-05-14 NOTE — Progress Notes (Signed)
Hematology/Oncology Consult note [phone] [phone]      Patient Care Team: Virginia Crews, MD as PCP - General (Family Medicine)  REFERRING PROVIDER: Virginia Crews, MD  CHIEF COMPLAINTS/REASON FOR VISIT:  Evaluation of history of DVT  HISTORY OF PRESENTING ILLNESS:   Amber Whitehead is a  40 y.o.  female with PMH listed below was seen in consultation at the request of  Brita Romp, Dionne Bucy, MD  for evaluation of history of DVT.  Patient developed pain and swelling of right lower extremity.  Patient had recent myomectomy surgery done on 07/02/2018 with immobility at that time. 07/17/2018, unilateral right lower extremity ultrasound showed DVT of the right lower extremity, nonocclusive thrombus in the right common femoral and femoral veins.  Complex solid and cystic mass in the right groin measuring up to 7.4 cm as described.  Nonspecific.   DVT was felt to be provoked by surgery.  Patient completed 3 months of Xarelto.  For right groin mass that was incidentally found on ultrasound 11/14/2018, she had another repeat ultrasound which showed right groin mass increasing size slightly 12/10/2018, CT abdomen pelvis with contrast showed soft tissue attenuation mass measuring approximately 8.1 x 6.7 x 3.6 cm contained within a right femoral hernia.  Nonspecific soft tissue attenuation nodule of left adrenal gland measuring 1.9 cm. 01/01/2019, core biopsy of right groin showed mesothelial lined fibroadipose tissue with mild chronic inflammation and reactive changes.  No evidence of malignancy.  11/3 2021, CT adrenal abdominal without contrast showed stable size of hypodense left adrenal gland nodule. 04/14/2021 CT abdomen pelvis with contrast showed increased size of fluid density collection.  Contained within the otherwise fat-containing right femoral hernia also now with increased compression of right femoral vein by the hernia and increased inflammatory  stranding within the herniated fat.  Leiomyomatous uterus.  Hypodense 1.8 cm left adrenal nodule is stable.  04/25/2021, patient developed acute pain of left lower extremity.   10/31 2022, ultrasound lower extremity left unilateral was positive for superficial venous thrombosis in the left great saphenous vein.  Involving proximal left calf and the distal left thigh.  Negative for DVT in left lower extremity.  Cystic fluid collection in the right groin.  Patient was started on Xarelto 10 mg for treatment of symptomatic SVT with the plan of 45 days duration  04/26/2021, patient was seen by Dr. Christian Mate for right incarcerated femoral hernia.  There is plan for robotic repair.  Surgery was canceled as pregnancy testing was positive.  Review of Systems  Constitutional:  Negative for appetite change, chills, fatigue and fever.  HENT:   Negative for hearing loss and voice change.   Eyes:  Negative for eye problems.  Respiratory:  Negative for chest tightness and cough.   Cardiovascular:  Negative for chest pain.  Gastrointestinal:  Negative for abdominal distention, abdominal pain and blood in stool.  Endocrine: Negative for hot flashes.  Genitourinary:  Negative for difficulty urinating and frequency.   Musculoskeletal:  Negative for arthralgias.       Left lower extremity pain secondary to SVT, improved since the start of Xarelto.  Skin:  Negative for itching and rash.  Neurological:  Negative for extremity weakness.  Hematological:  Negative for adenopathy.  Psychiatric/Behavioral:  Negative for confusion.    MEDICAL HISTORY:  Past Medical History:  Diagnosis Date   Acute superficial venous thrombosis of left lower extremity 04/25/2021   left great saphenous vein   Depression    History of  premature delivery    of twins   Irritable bowel syndrome (IBS)    Pneumonia 10/2014   Right leg DVT ([place]) 2020   post surgery; took Xarelto for about 3 months after   Seasonal allergies    Uterine  fibroid     SURGICAL HISTORY: Past Surgical History:  Procedure Laterality Date   ROBOT ASSISTED MYOMECTOMY N/A 07/12/2018   Procedure: XI ROBOTIC ASSISTED LAPAROSCOPIC MYOMECTOMY;  Surgeon: Governor Specking, MD;  Location: [facility];  Service: Gynecology;  Laterality: N/A;   WISDOM TOOTH EXTRACTION      SOCIAL HISTORY: Social History   Socioeconomic History   Marital status: Married    Spouse name: Jonathon   Number of children: Not on file   Years of education: Not on file   Highest education level: Not on file  Occupational History   Not on file  Tobacco Use   Smoking status: Never   Smokeless tobacco: Never  Vaping Use   Vaping Use: Never used  Substance and Sexual Activity   Alcohol use: No   Drug use: No   Sexual activity: Not on file  Other Topics Concern   Not on file  Social History Narrative   Not on file   Social Determinants of Health   Financial Resource Strain: Not on file  Food Insecurity: Not on file  Transportation Needs: Not on file  Physical Activity: Not on file  Stress: Not on file  Social Connections: Not on file  Intimate Partner Violence: Not on file    FAMILY HISTORY: Family History  Problem Relation Age of Onset   Healthy Mother    Healthy Father    Healthy Sister    Lung cancer Maternal Grandmother    Healthy Sister    Breast cancer Neg Hx    Cancer - Colon Neg Hx     ALLERGIES:  has No Known Allergies.  MEDICATIONS:  Current Outpatient Medications  Medication Sig Dispense Refill   acetaminophen (TYLENOL) 325 MG tablet Take 650 mg by mouth every 6 (six) hours as needed for moderate pain or headache.     sertraline (ZOLOFT) 50 MG tablet Take 50 mg by mouth at bedtime.      enoxaparin (LOVENOX) 120 MG/0.8ML injection Inject 0.8 mLs (120 mg total) into the skin every 12 (twelve) hours. 48 mL 0   No current facility-administered medications for this visit.     PHYSICAL EXAMINATION: ECOG PERFORMANCE  STATUS: 1 - Symptomatic but completely ambulatory Vitals:   05/09/21 1452  BP: 129/83  Pulse: 96  Temp: (!) 96.8 F (36 C)   Filed Weights   05/09/21 1452  Weight: 260 lb (117.9 kg)    Physical Exam Constitutional:      General: She is not in acute distress.    Appearance: She is obese.  HENT:     Head: Normocephalic and atraumatic.  Eyes:     General: No scleral icterus. Cardiovascular:     Rate and Rhythm: Normal rate and regular rhythm.     Heart sounds: Normal heart sounds.  Pulmonary:     Effort: Pulmonary effort is normal. No respiratory distress.     Breath sounds: No wheezing.  Abdominal:     General: Bowel sounds are normal. There is no distension.     Palpations: Abdomen is soft.  Musculoskeletal:        General: No deformity. Normal range of motion.     Cervical back: Normal range of motion and  neck supple.  Skin:    General: Skin is warm and dry.     Findings: No erythema or rash.  Neurological:     Mental Status: She is alert and oriented to person, place, and time. Mental status is at baseline.     Cranial Nerves: No cranial nerve deficit.     Coordination: Coordination normal.  Psychiatric:        Mood and Affect: Mood normal.    LABORATORY DATA:  I have reviewed the data as listed Lab Results  Component Value Date   WBC 6.6 05/03/2021   HGB 11.9 05/03/2021   HCT 37.0 05/03/2021   MCV 92 05/03/2021   PLT 329 05/03/2021   Recent Labs    05/03/21 0833  NA 135  K 4.2  CL 101  CO2 20  GLUCOSE 89  BUN 9  CREATININE 0.66  CALCIUM 8.9  PROT 6.1  ALBUMIN 3.7*  AST 12  ALT 12  ALKPHOS 62  BILITOT 0.3   Iron/TIBC/Ferritin/ %Sat No results found for: IRON, TIBC, FERRITIN, IRONPCTSAT    RADIOGRAPHIC STUDIES: I have personally reviewed the radiological images as listed and agreed with the findings in the report. CT Abdomen Pelvis W Contrast  Result Date: 04/14/2021 CLINICAL DATA:  Right lower abdominal hernia x1 week. EXAM: CT ABDOMEN  AND PELVIS WITH CONTRAST TECHNIQUE: Multidetector CT imaging of the abdomen and pelvis was performed using the standard protocol following bolus administration of intravenous contrast. CONTRAST:  150mL OMNIPAQUE IOHEXOL 300 MG/ML  SOLN COMPARISON:  CT April 27, 2020 and December 10 2018 FINDINGS: Lower chest: No acute abnormality. Hepatobiliary: No focal liver abnormality is seen. No gallstones, gallbladder wall thickening, or biliary dilatation. Pancreas: No pancreatic ductal dilation or evidence of acute inflammation. Spleen: Within normal limits. Adrenals/Urinary Tract: 1.8 cm left adrenal nodule stable since December 10, 2018 most consistent with a benign adrenal adenoma. Right adrenal glands unremarkable. No hydronephrosis. No solid enhancing renal mass. Urinary bladder is unremarkable for degree of distension. Stomach/Bowel: Radiopaque enteric contrast traverses the sigmoid colon. Stomach is unremarkable for degree of distension. No pathologic dilation of small or large bowel. The appendix and terminal appear normal. No evidence of acute bowel inflammation. Vascular/Lymphatic: No abdominal aortic aneurysm. No pathologically enlarged abdominal or pelvic lymph nodes. Reproductive: Leiomyomatous uterus. Other: Increased size of the fluid density collection contained within the otherwise fat containing right femoral hernia which now measures 9.9 x 5.8 x 7.5 cm on images 86/2 and 43/6, previously 8.1 x 6.7 x 3.6 cm. There is increased compression of the right femoral vein by the hernia as seen on image 89/2 with increased inflammatory stranding within the herniated fat. Musculoskeletal: Stable sclerotic lesion in the posterior aspect of the L2 vertebral body, likely a bone island. No acute osseous abnormality. IMPRESSION: 1. Increased size of the fluid density collection, now measuring 9.9 x 5.8 x 7.5 cm, contained within the otherwise fat containing right femoral hernia also now with increased compression of the right  femoral vein by the hernia and increased inflammatory stranding within the herniated fat. Recommend correlation with physical examination for reducibility 2. Leiomyomatous uterus. 3. Hypodense 1.8 cm left adrenal nodule which is stable dating back to December 10, 2018 most likely reflecting a benign adrenal adenoma. Electronically Signed   By: Dahlia Bailiff M.D.   On: 04/14/2021 14:34   US Venous Img Lower Unilateral Left (DVT)  Result Date: 04/25/2021 CLINICAL DATA:  Left leg pain and redness. EXAM: LEFT LOWER EXTREMITY VENOUS  DOPPLER ULTRASOUND TECHNIQUE: Gray-scale sonography with graded compression, as well as color Doppler and duplex ultrasound were performed to evaluate the lower extremity deep venous systems from the level of the common femoral vein and including the common femoral, femoral, profunda femoral, popliteal and calf veins including the posterior tibial, peroneal and gastrocnemius veins when visible. The superficial great saphenous vein was also interrogated. Spectral Doppler was utilized to evaluate flow at rest and with distal augmentation maneuvers in the common femoral, femoral and popliteal veins. COMPARISON:  CT pelvis 04/14/2021 FINDINGS: Contralateral Common Femoral Vein: Respiratory phasicity is normal and symmetric with the symptomatic side. No evidence of thrombus. Normal compressibility. Common Femoral Vein: No evidence of thrombus. Normal compressibility, respiratory phasicity and response to augmentation. Saphenofemoral Junction: No evidence of thrombus. Normal compressibility and flow on color Doppler imaging. Profunda Femoral Vein: No evidence of thrombus. Normal compressibility and flow on color Doppler imaging. Femoral Vein: No evidence of thrombus. Normal compressibility, respiratory phasicity and response to augmentation. Popliteal Vein: No evidence of thrombus. Normal compressibility, respiratory phasicity and response to augmentation. Calf Veins: No evidence of thrombus.  Normal compressibility and flow on color Doppler imaging. Superficial Great Saphenous Vein: Positive for thrombus. Distal thigh great saphenous vein is noncompressible with echogenic thrombus. This corresponds with the area of pain and redness. GSV is also thrombosed in the proximal calf. Other Findings: Large anechoic structure in the right groin corresponding with the cystic structure on previous CT. IMPRESSION: 1. Positive for superficial venous thrombosis in the left great saphenous vein (GSV). GSV thrombus involves the proximal left calf and the distal left thigh. 2.  Negative for deep venous thrombosis in left lower extremity. 3. Again noted is a large fluid or cystic collection in the right groin. Electronically Signed   By: Markus Daft M.D.   On: 04/25/2021 14:42      ASSESSMENT & PLAN:  1. History of deep vein thrombosis (DVT) of lower extremity   2. Thrombophlebitis of superficial veins of left lower extremity   3. First trimester pregnancy    #Symptomatic superficial thrombosis of left lower extremity. History of provoked DVT. Currently on Xarelto 10 mg daily. Pregnancy, first trimester. SVT could be provoked by pregnancy.  She is at high risk of developing thrombosis, pregnancy, obesity, previous history of DVT,  Recommend patient to switch to therapeutic dose of Lovenox 1 mg/kilogram twice daily for now. RX sent to pharmacy and RN to notify patient.  Check hypercoagulable work-up.  Orders Placed This Encounter  Procedures   ANTIPHOSPHOLIPID SYNDROME PROF    Standing Status:   Future    Number of Occurrences:   1    Standing Expiration Date:   05/09/2022   Prothrombin gene mutation    Standing Status:   Future    Number of Occurrences:   1    Standing Expiration Date:   05/09/2022   Factor 5 leiden    Standing Status:   Future    Number of Occurrences:   1    Standing Expiration Date:   05/09/2022   Protein S, total and free    Standing Status:   Future    Number of  Occurrences:   1    Standing Expiration Date:   05/09/2022   Protein C activity    Standing Status:   Future    Number of Occurrences:   1    Standing Expiration Date:   05/09/2022    All questions were answered. The patient knows to  call the clinic with any problems questions or concerns.   Virginia Crews, MD    Return of visit: 3-4 weeks to go over results.  Thank you for this kind referral and the opportunity to participate in the care of this patient. A copy of today's note is routed to referring provider   Earlie Server, MD, PhD [facility] Hematology Oncology 05/14/2021

## 2021-05-16 LAB — PROTHROMBIN GENE MUTATION

## 2021-05-16 LAB — FACTOR 5 LEIDEN

## 2021-05-25 LAB — RESULTS CONSOLE HPV: CHL HPV: NEGATIVE

## 2021-05-25 LAB — OB RESULTS CONSOLE ABO/RH: RH Type: POSITIVE

## 2021-05-25 LAB — OB RESULTS CONSOLE HIV ANTIBODY (ROUTINE TESTING): HIV: NONREACTIVE

## 2021-05-25 LAB — OB RESULTS CONSOLE RUBELLA ANTIBODY, IGM: Rubella: NON-IMMUNE/NOT IMMUNE

## 2021-05-25 LAB — CBC AND DIFFERENTIAL
HCT: 39 (ref 36–46)
Hemoglobin: 12.4 (ref 12.0–16.0)
Neutrophils Absolute: 4.4
Platelets: 306 (ref 150–399)
WBC: 6.6

## 2021-05-25 LAB — OB RESULTS CONSOLE ANTIBODY SCREEN: Antibody Screen: NEGATIVE

## 2021-05-25 LAB — HM PAP SMEAR: HM Pap smear: NEGATIVE

## 2021-05-25 LAB — OB RESULTS CONSOLE HEPATITIS B SURFACE ANTIGEN: Hepatitis B Surface Ag: NEGATIVE

## 2021-05-25 LAB — HEPATITIS C ANTIBODY: HCV Ab: NEGATIVE

## 2021-05-25 LAB — HM HEPATITIS C SCREENING LAB: HM Hepatitis Screen: NEGATIVE

## 2021-05-25 LAB — OB RESULTS CONSOLE GC/CHLAMYDIA
Chlamydia: NEGATIVE
Neisseria Gonorrhea: NEGATIVE

## 2021-05-25 LAB — OB RESULTS CONSOLE RPR: RPR: NONREACTIVE

## 2021-05-25 LAB — CBC: RBC: 4.34 (ref 3.87–5.11)

## 2021-05-25 LAB — HM HIV SCREENING LAB: HM HIV Screening: NEGATIVE

## 2021-06-06 ENCOUNTER — Inpatient Hospital Stay: Payer: BC Managed Care – PPO | Attending: Oncology | Admitting: Oncology

## 2021-06-06 ENCOUNTER — Other Ambulatory Visit: Payer: Self-pay

## 2021-06-06 ENCOUNTER — Encounter (HOSPITAL_COMMUNITY): Payer: Self-pay | Admitting: Anesthesiology

## 2021-06-06 ENCOUNTER — Encounter: Payer: Self-pay | Admitting: Oncology

## 2021-06-06 ENCOUNTER — Encounter: Payer: Self-pay | Admitting: Family Medicine

## 2021-06-06 VITALS — BP 137/93 | HR 87 | Temp 97.8°F | Resp 18 | Wt 251.7 lb

## 2021-06-06 DIAGNOSIS — Z3A12 12 weeks gestation of pregnancy: Secondary | ICD-10-CM | POA: Diagnosis not present

## 2021-06-06 DIAGNOSIS — O99111 Other diseases of the blood and blood-forming organs and certain disorders involving the immune mechanism complicating pregnancy, first trimester: Secondary | ICD-10-CM | POA: Diagnosis present

## 2021-06-06 DIAGNOSIS — D6852 Prothrombin gene mutation: Secondary | ICD-10-CM | POA: Diagnosis not present

## 2021-06-06 DIAGNOSIS — Z86718 Personal history of other venous thrombosis and embolism: Secondary | ICD-10-CM | POA: Insufficient documentation

## 2021-06-06 DIAGNOSIS — Z7901 Long term (current) use of anticoagulants: Secondary | ICD-10-CM | POA: Insufficient documentation

## 2021-06-06 DIAGNOSIS — Z3492 Encounter for supervision of normal pregnancy, unspecified, second trimester: Secondary | ICD-10-CM

## 2021-06-06 NOTE — Progress Notes (Signed)
Hematology/Oncology Progress note [phone] [phone]      Patient Care Team: Virginia Crews, MD as PCP - General (Family Medicine)  REFERRING PROVIDER: Virginia Crews, MD  CHIEF COMPLAINTS/REASON FOR VISIT:  Follow up for history of DVT  HISTORY OF PRESENTING ILLNESS:   Amber Whitehead is a  40 y.o.  female with PMH listed below was seen in consultation at the request of  Brita Romp, Dionne Bucy, MD  for evaluation of history of DVT.  Patient developed pain and swelling of right lower extremity.  Patient had recent myomectomy surgery done on 07/02/2018 with immobility at that time. 07/17/2018, unilateral right lower extremity ultrasound showed DVT of the right lower extremity, nonocclusive thrombus in the right common femoral and femoral veins.  Complex solid and cystic mass in the right groin measuring up to 7.4 cm as described.  Nonspecific.   DVT was felt to be provoked by surgery.  Patient completed 3 months of Xarelto.  For right groin mass that was incidentally found on ultrasound 11/14/2018, she had another repeat ultrasound which showed right groin mass increasing size slightly 12/10/2018, CT abdomen pelvis with contrast showed soft tissue attenuation mass measuring approximately 8.1 x 6.7 x 3.6 cm contained within a right femoral hernia.  Nonspecific soft tissue attenuation nodule of left adrenal gland measuring 1.9 cm. 01/01/2019, core biopsy of right groin showed mesothelial lined fibroadipose tissue with mild chronic inflammation and reactive changes.  No evidence of malignancy.  11/3 2021, CT adrenal abdominal without contrast showed stable size of hypodense left adrenal gland nodule. 04/14/2021 CT abdomen pelvis with contrast showed increased size of fluid density collection.  Contained within the otherwise fat-containing right femoral hernia also now with increased compression of right femoral vein by the hernia and increased inflammatory  stranding within the herniated fat.  Leiomyomatous uterus.  Hypodense 1.8 cm left adrenal nodule is stable.  04/25/2021, patient developed acute pain of left lower extremity.   10/31 2022, ultrasound lower extremity left unilateral was positive for superficial venous thrombosis in the left great saphenous vein.  Involving proximal left calf and the distal left thigh.  Negative for DVT in left lower extremity.  Cystic fluid collection in the right groin.  Patient was started on Xarelto 10 mg for treatment of symptomatic SVT with the plan of 45 days duration  04/26/2021, patient was seen by Dr. Christian Mate for right incarcerated femoral hernia.  There is plan for robotic repair.  Surgery was canceled as pregnancy testing was positive.  INTERVAL HISTORY [name] is a 40 y.o. female who has above history reviewed by me today presents for follow up visit for anticoagulation, history of DVT, heterozygous prothrombin gene mutation, in pregnancy.  Her anticoagulation has been changed to therapeutics Lovenox 1mg /kg BID. 12 weeks pregnancy. She denies active bleeding events.  She has bruising and tissue hardening around sites of injections.  Left lower extremity pain has improved.   Review of Systems  Constitutional:  Negative for appetite change, chills, fatigue and fever.  HENT:   Negative for hearing loss and voice change.   Eyes:  Negative for eye problems.  Respiratory:  Negative for chest tightness and cough.   Cardiovascular:  Negative for chest pain.  Gastrointestinal:  Negative for abdominal distention, abdominal pain and blood in stool.  Endocrine: Negative for hot flashes.  Genitourinary:  Negative for difficulty urinating and frequency.   Musculoskeletal:  Negative for arthralgias.       Left lower extremity  pain has resolved.   Skin:  Negative for itching and rash.  Neurological:  Negative for extremity weakness.  Hematological:  Negative for adenopathy. Bruises/bleeds easily.   Psychiatric/Behavioral:  Negative for confusion.    MEDICAL HISTORY:  Past Medical History:  Diagnosis Date   Acute superficial venous thrombosis of left lower extremity 04/25/2021   left great saphenous vein   Depression    History of premature delivery    of twins   Irritable bowel syndrome (IBS)    Pneumonia 10/2014   Right leg DVT ([place]) 2020   post surgery; took Xarelto for about 3 months after   Seasonal allergies    Uterine fibroid     SURGICAL HISTORY: Past Surgical History:  Procedure Laterality Date   ROBOT ASSISTED MYOMECTOMY N/A 07/12/2018   Procedure: XI ROBOTIC [place];  Surgeon: Governor Specking, MD;  Location: [facility];  Service: Gynecology;  Laterality: N/A;   WISDOM TOOTH EXTRACTION      SOCIAL HISTORY: Social History   Socioeconomic History   Marital status: Married    Spouse name: Jonathon   Number of children: Not on file   Years of education: Not on file   Highest education level: Not on file  Occupational History   Not on file  Tobacco Use   Smoking status: Never   Smokeless tobacco: Never  Vaping Use   Vaping Use: Never used  Substance and Sexual Activity   Alcohol use: No   Drug use: No   Sexual activity: Not on file  Other Topics Concern   Not on file  Social History Narrative   Not on file   Social Determinants of Health   Financial Resource Strain: Not on file  Food Insecurity: Not on file  Transportation Needs: Not on file  Physical Activity: Not on file  Stress: Not on file  Social Connections: Not on file  Intimate Partner Violence: Not on file    FAMILY HISTORY: Family History  Problem Relation Age of Onset   Healthy Mother    Healthy Father    Healthy Sister    Lung cancer Maternal Grandmother    Healthy Sister    Breast cancer Neg Hx    Cancer - Colon Neg Hx     ALLERGIES:  has No Known Allergies.  MEDICATIONS:  Current Outpatient Medications  Medication Sig  Dispense Refill   acetaminophen (TYLENOL) 325 MG tablet Take 650 mg by mouth every 6 (six) hours as needed for moderate pain or headache.     enoxaparin (LOVENOX) 120 MG/0.8ML injection Inject 0.8 mLs (120 mg total) into the skin every 12 (twelve) hours. 48 mL 0   Prenatal Vit-Fe Fumarate-FA (PRENATAL VITAMINS PO) Prenatal Vitamin     sertraline (ZOLOFT) 50 MG tablet Take 50 mg by mouth at bedtime.      No current facility-administered medications for this visit.     PHYSICAL EXAMINATION: ECOG PERFORMANCE STATUS: 1 - Symptomatic but completely ambulatory Vitals:   06/06/21 1441  BP: (!) 137/93  Pulse: 87  Resp: 18  Temp: 97.8 F (36.6 C)   Filed Weights   06/06/21 1441  Weight: 251 lb 11.2 oz (114.2 kg)    Physical Exam Constitutional:      General: She is not in acute distress.    Appearance: She is obese.  HENT:     Head: Normocephalic and atraumatic.  Eyes:     General: No scleral icterus. Cardiovascular:     Rate and  Rhythm: Normal rate and regular rhythm.     Heart sounds: Normal heart sounds.  Pulmonary:     Effort: Pulmonary effort is normal. No respiratory distress.     Breath sounds: No wheezing.  Abdominal:     Palpations: Abdomen is soft.  Musculoskeletal:        General: No deformity. Normal range of motion.     Cervical back: Normal range of motion and neck supple.  Skin:    General: Skin is warm and dry.     Findings: Bruising present. No erythema or rash.  Neurological:     Mental Status: She is alert and oriented to person, place, and time. Mental status is at baseline.     Cranial Nerves: No cranial nerve deficit.     Coordination: Coordination normal.  Psychiatric:        Mood and Affect: Mood normal.    LABORATORY DATA:  I have reviewed the data as listed Lab Results  Component Value Date   WBC 6.6 05/25/2021   HGB 12.4 05/25/2021   HCT 39 05/25/2021   MCV 92 05/03/2021   PLT 306 05/25/2021   Recent Labs    05/03/21 0833  NA 135   K 4.2  CL 101  CO2 20  GLUCOSE 89  BUN 9  CREATININE 0.66  CALCIUM 8.9  PROT 6.1  ALBUMIN 3.7*  AST 12  ALT 12  ALKPHOS 62  BILITOT 0.3    Iron/TIBC/Ferritin/ %Sat No results found for: IRON, TIBC, FERRITIN, IRONPCTSAT    RADIOGRAPHIC STUDIES: I have personally reviewed the radiological images as listed and agreed with the findings in the report. No results found.    ASSESSMENT & PLAN:  1. History of deep vein thrombosis (DVT) of lower extremity   2. [redacted] weeks gestation of pregnancy   3. Prothrombin gene mutation [facility])    #Symptomatic superficial thrombosis of left lower extremity, history of provoked DVT. Heterozygous prothrombin gene mutation, [redacted] weeks pregnant. Her VTE risk increases during pregnancy, inpatient with inherited thrombophilia, risk of VTE is increased above the risk associated with pregnancy alone.  She has other risk factors including history of DVT, age >35, obesity. Recommend patient to continue antepartum therapeutic anticoagulation with Lovenox 1 mg/kilogram twice daily. Patient thinks that her OB/GYN will refer her medication.  She will call my office if she wants me to refill her Lovenox.  Orders Placed This Encounter  Procedures   CBC with Differential/Platelet    Standing Status:   Future    Standing Expiration Date:   06/06/2022   Comprehensive metabolic panel    Standing Status:   Future    Standing Expiration Date:   06/06/2022    All questions were answered. The patient knows to call the clinic with any problems questions or concerns.  cc Virginia Crews, MD    Return of visit: 6 weeks   Earlie Server, MD, PhD  06/06/2021

## 2021-06-06 NOTE — Progress Notes (Signed)
Pt here for follow up. No new concerns voiced.   

## 2021-06-07 ENCOUNTER — Encounter: Payer: Self-pay | Admitting: Oncology

## 2021-06-07 DIAGNOSIS — Z3A12 12 weeks gestation of pregnancy: Secondary | ICD-10-CM | POA: Insufficient documentation

## 2021-06-07 DIAGNOSIS — D6852 Prothrombin gene mutation: Secondary | ICD-10-CM | POA: Insufficient documentation

## 2021-06-10 ENCOUNTER — Ambulatory Visit: Payer: BC Managed Care – PPO | Admitting: Family Medicine

## 2021-06-14 ENCOUNTER — Encounter (HOSPITAL_COMMUNITY): Payer: Self-pay | Admitting: *Deleted

## 2021-06-14 ENCOUNTER — Telehealth (HOSPITAL_COMMUNITY): Payer: Self-pay | Admitting: *Deleted

## 2021-06-14 NOTE — Telephone Encounter (Signed)
Preadmission screen  

## 2021-06-23 ENCOUNTER — Encounter (HOSPITAL_COMMUNITY): Admission: RE | Payer: Self-pay | Source: Home / Self Care

## 2021-06-23 ENCOUNTER — Ambulatory Visit (HOSPITAL_COMMUNITY)
Admission: RE | Admit: 2021-06-23 | Payer: BC Managed Care – PPO | Source: Home / Self Care | Admitting: Obstetrics and Gynecology

## 2021-06-23 DIAGNOSIS — O3432 Maternal care for cervical incompetence, second trimester: Secondary | ICD-10-CM

## 2021-06-23 SURGERY — CERCLAGE, CERVIX, VAGINAL APPROACH
Anesthesia: Spinal

## 2021-06-24 ENCOUNTER — Other Ambulatory Visit (HOSPITAL_COMMUNITY): Payer: Self-pay | Admitting: Obstetrics and Gynecology

## 2021-06-24 ENCOUNTER — Other Ambulatory Visit: Payer: Self-pay | Admitting: Obstetrics and Gynecology

## 2021-06-24 DIAGNOSIS — N883 Incompetence of cervix uteri: Secondary | ICD-10-CM

## 2021-06-29 ENCOUNTER — Encounter (HOSPITAL_COMMUNITY): Payer: Self-pay | Admitting: Obstetrics and Gynecology

## 2021-06-29 NOTE — H&P (Signed)
Amber Whitehead is an 41 y.o. female [phone] at 72 1/7 weeks (EDD 12/21/21 by :LMP c/w 8 week [place]) presents for cervical cerclage for incompetent cervix.  Prenatal care significant for: 1) Depressive disorder  stable on zoloft 2) Cervical incompetence  Dr. 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   Recently she was diagnosed with a superficial blood clot in left leg On therapeutic lovenox 120mg  twice daily 4) Inguinal hernia  has an enlarging right hernia, was for surgery and found out day of she was pregnant 5) Advanced Maternal Age  NIPT drawn but not resulted due to poor fraction, second draw pending 6) Obesity (BMI 30+)  BMI 48 7) History of premature delivery  Preterm twinswins delivered at 26 weeks 09-10-2011 One died after the birth and the other died at 58 years 59f age with severe delays Progesterone at 75 weeks 8) History of myomectomy  2018/09/10 Robotic myomectomy with Dr. Kerin Perna Multiple fibroids and one cervical fibroid removed Needs c-section at 37 weeks   OB History:  03-26-2012, 24 wks NSDV x 2 [place] triage (one deceased after birth and the other at 41 y/o)  SAB x 1  Menstrual History Patient's last menstrual period was 03/14/2021 (approximate).    Past Medical History:  Diagnosis Date   Acute superficial venous thrombosis of left lower extremity 04/25/2021   left great saphenous vein   Depression    History of premature delivery    of twins   Irritable bowel syndrome (IBS)    Pneumonia 10/2014   Right leg DVT ([place]) 09/10/2018   post surgery; took Xarelto for about 3 months after   Seasonal allergies    Uterine fibroid     Past Surgical History:  Procedure Laterality Date   ROBOT ASSISTED MYOMECTOMY N/A 07/12/2018   Procedure: XI ROBOTIC [place];  Surgeon: Governor Specking, MD;  Location: [facility];  Service: Gynecology;  Laterality: N/A;   WISDOM TOOTH EXTRACTION      Family History  Problem Relation Age of Onset   Healthy Mother    Healthy Father    Healthy Sister    Lung cancer Maternal Grandmother    Healthy Sister    Breast cancer Neg Hx    Cancer - Colon Neg Hx     Social History:  reports that she has never smoked. She has never used smokeless tobacco. She reports that she does not drink alcohol and does not use drugs.  Allergies: No Known Allergies  No medications prior to admission.    Review of Systems  Constitutional:  Negative for fever.  Cardiovascular:  Negative for chest pain.  Gastrointestinal:  Negative for abdominal pain.   Last menstrual period 03/14/2021. Physical Exam Constitutional:      Appearance: She is obese.  Cardiovascular:     Rate and Rhythm: Normal rate and regular rhythm.  Pulmonary:     Effort: Pulmonary effort is normal.  Abdominal:     Palpations: Abdomen is soft.  Genitourinary:    General: Normal vulva.  Neurological:     Mental Status: She is alert.  Psychiatric:        Mood and Affect: Mood normal.    No results found for this or any previous visit (from the past 24 hour(s)).  No results found.  Assessment/Plan: Pt aware of risks and benefits of cerclage including bleeding  infection and possible rupture of membranes with pregnancy loss.  We have drawn NIPT x 2 and are still awaiting result as of DOS.  Pt desires to proceed with cerclage as would carry pregnancy regardless of outcome.  Logan Bores 06/29/2021, 6:52 PM

## 2021-06-30 ENCOUNTER — Inpatient Hospital Stay (HOSPITAL_COMMUNITY): Payer: BC Managed Care – PPO | Admitting: Anesthesiology

## 2021-06-30 ENCOUNTER — Ambulatory Visit (HOSPITAL_COMMUNITY)
Admission: RE | Admit: 2021-06-30 | Discharge: 2021-06-30 | Disposition: A | Discharge status: Home / Self Care | Payer: BC Managed Care – PPO | Attending: Obstetrics and Gynecology | Admitting: Obstetrics and Gynecology

## 2021-06-30 ENCOUNTER — Other Ambulatory Visit: Payer: Self-pay

## 2021-06-30 ENCOUNTER — Encounter (HOSPITAL_COMMUNITY): Payer: Self-pay | Admitting: Obstetrics and Gynecology

## 2021-06-30 ENCOUNTER — Encounter (HOSPITAL_COMMUNITY)
Admission: RE | Disposition: A | Discharge status: Home / Self Care | Payer: Self-pay | Source: Home / Self Care | Attending: Obstetrics and Gynecology

## 2021-06-30 DIAGNOSIS — Z3A15 15 weeks gestation of pregnancy: Secondary | ICD-10-CM | POA: Diagnosis not present

## 2021-06-30 DIAGNOSIS — N888 Other specified noninflammatory disorders of cervix uteri: Secondary | ICD-10-CM | POA: Insufficient documentation

## 2021-06-30 DIAGNOSIS — Z86718 Personal history of other venous thrombosis and embolism: Secondary | ICD-10-CM | POA: Insufficient documentation

## 2021-06-30 DIAGNOSIS — O3432 Maternal care for cervical incompetence, second trimester: Secondary | ICD-10-CM | POA: Insufficient documentation

## 2021-06-30 DIAGNOSIS — O99212 Obesity complicating pregnancy, second trimester: Secondary | ICD-10-CM | POA: Diagnosis not present

## 2021-06-30 DIAGNOSIS — Z9889 Other specified postprocedural states: Secondary | ICD-10-CM | POA: Diagnosis not present

## 2021-06-30 DIAGNOSIS — Z20822 Contact with and (suspected) exposure to covid-19: Secondary | ICD-10-CM | POA: Insufficient documentation

## 2021-06-30 HISTORY — PX: CERVICAL CERCLAGE: SHX1329

## 2021-06-30 LAB — CBC
HCT: 36.3 % (ref 36.0–46.0)
Hemoglobin: 11.8 g/dL — ABNORMAL LOW (ref 12.0–15.0)
MCH: 28.7 pg (ref 26.0–34.0)
MCHC: 32.5 g/dL (ref 30.0–36.0)
MCV: 88.3 fL (ref 80.0–100.0)
Platelets: 257 10*3/uL (ref 150–400)
RBC: 4.11 MIL/uL (ref 3.87–5.11)
RDW: 13.2 % (ref 11.5–15.5)
WBC: 8.1 10*3/uL (ref 4.0–10.5)
nRBC: 0 % (ref 0.0–0.2)

## 2021-06-30 LAB — RESP PANEL BY RT-PCR (FLU A&B, COVID) ARPGX2
Influenza A by PCR: NEGATIVE
Influenza B by PCR: NEGATIVE
SARS Coronavirus 2 by RT PCR: NEGATIVE

## 2021-06-30 SURGERY — CERCLAGE, CERVIX, VAGINAL APPROACH
Anesthesia: Spinal | Wound class: Clean Contaminated

## 2021-06-30 MED ORDER — FENTANYL CITRATE (PF) 100 MCG/2ML IJ SOLN
INTRAMUSCULAR | Status: DC | PRN
  Administered 2021-06-30: 25 ug via INTRAVENOUS
  Administered 2021-06-30: 50 ug via INTRAVENOUS

## 2021-06-30 MED ORDER — OXYCODONE HCL 5 MG PO TABS: 5.0000 mg | ORAL_TABLET | Freq: Once | ORAL | Status: DC | PRN

## 2021-06-30 MED ORDER — POVIDONE-IODINE 10 % EX SWAB
2.0000 "application " | Freq: Once | CUTANEOUS | Status: AC
  Administered 2021-06-30: 2 via TOPICAL

## 2021-06-30 MED ORDER — LACTATED RINGERS IV SOLN: INTRAVENOUS | Status: DC

## 2021-06-30 MED ORDER — PROMETHAZINE HCL 25 MG/ML IJ SOLN: 6.2500 mg | INTRAMUSCULAR | Status: DC | PRN

## 2021-06-30 MED ORDER — NALOXONE HCL 4 MG/10ML IJ SOLN
1.0000 ug/kg/h | INTRAVENOUS | Status: DC | PRN
  Filled 2021-06-30: qty 5

## 2021-06-30 MED ORDER — SODIUM CHLORIDE 0.9 % IR SOLN
Status: DC | PRN
  Administered 2021-06-30: 1

## 2021-06-30 MED ORDER — ONDANSETRON HCL 4 MG/2ML IJ SOLN
INTRAMUSCULAR | Status: DC | PRN
  Administered 2021-06-30: 4 mg via INTRAVENOUS

## 2021-06-30 MED ORDER — DIPHENHYDRAMINE HCL 25 MG PO CAPS: 25.0000 mg | ORAL_CAPSULE | ORAL | Status: DC | PRN

## 2021-06-30 MED ORDER — OXYCODONE HCL 5 MG/5ML PO SOLN: 5.0000 mg | Freq: Once | ORAL | Status: DC | PRN

## 2021-06-30 MED ORDER — SODIUM CHLORIDE 0.9% FLUSH: 3.0000 mL | INTRAVENOUS | Status: DC | PRN

## 2021-06-30 MED ORDER — NALOXONE HCL 0.4 MG/ML IJ SOLN: 0.4000 mg | INTRAMUSCULAR | Status: DC | PRN

## 2021-06-30 MED ORDER — CHLOROPROCAINE HCL 50 MG/5ML IT SOLN
INTRATHECAL | Status: AC
  Filled 2021-06-30: qty 5

## 2021-06-30 MED ORDER — FENTANYL CITRATE (PF) 100 MCG/2ML IJ SOLN: 25.0000 ug | INTRAMUSCULAR | Status: DC | PRN

## 2021-06-30 MED ORDER — FENTANYL CITRATE (PF) 100 MCG/2ML IJ SOLN
INTRAMUSCULAR | Status: DC | PRN
  Administered 2021-06-30: 25 ug via INTRATHECAL

## 2021-06-30 MED ORDER — ONDANSETRON HCL 4 MG/2ML IJ SOLN
INTRAMUSCULAR | Status: AC
  Filled 2021-06-30: qty 2

## 2021-06-30 MED ORDER — ONDANSETRON HCL 4 MG/2ML IJ SOLN: 4.0000 mg | Freq: Three times a day (TID) | INTRAMUSCULAR | Status: DC | PRN

## 2021-06-30 MED ORDER — ACETAMINOPHEN 10 MG/ML IV SOLN: 1000.0000 mg | Freq: Once | INTRAVENOUS | Status: DC | PRN

## 2021-06-30 MED ORDER — CHLOROPROCAINE HCL 50 MG/5ML IT SOLN
INTRATHECAL | Status: DC | PRN
  Administered 2021-06-30: 4 mL via INTRATHECAL

## 2021-06-30 MED ORDER — SCOPOLAMINE 1 MG/3DAYS TD PT72: 1.0000 | MEDICATED_PATCH | Freq: Once | TRANSDERMAL | Status: DC

## 2021-06-30 MED ORDER — MEPERIDINE HCL 25 MG/ML IJ SOLN: 6.2500 mg | INTRAMUSCULAR | Status: DC | PRN

## 2021-06-30 MED ORDER — DIPHENHYDRAMINE HCL 50 MG/ML IJ SOLN: 12.5000 mg | INTRAMUSCULAR | Status: DC | PRN

## 2021-06-30 MED ORDER — FENTANYL CITRATE (PF) 100 MCG/2ML IJ SOLN
INTRAMUSCULAR | Status: AC
  Filled 2021-06-30: qty 2

## 2021-06-30 SURGICAL SUPPLY — 14 items
GLOVE BIO SURGEON STRL SZ 6.5 (GLOVE) ×4 IMPLANT
GOWN STRL REUS W/TWL LRG LVL3 (GOWN DISPOSABLE) ×4 IMPLANT
NDL MAYO CATGUT SZ4 TPR NDL (NEEDLE) ×1 IMPLANT
NEEDLE MAYO CATGUT SZ4 (NEEDLE) ×2 IMPLANT
NS IRRIG 1000ML POUR BTL (IV SOLUTION) ×2 IMPLANT
PACK VAGINAL MINOR WOMEN LF (CUSTOM PROCEDURE TRAY) ×2 IMPLANT
PAD OB MATERNITY 4.3X12.25 (PERSONAL CARE ITEMS) ×2 IMPLANT
PAD PREP 24X48 CUFFED NSTRL (MISCELLANEOUS) ×2 IMPLANT
SUT POLYDEK 5 CE 75 36 (SUTURE) ×2 IMPLANT
SYR BULB IRRIGATION 50ML (SYRINGE) ×2 IMPLANT
TOWEL OR 17X24 6PK STRL BLUE (TOWEL DISPOSABLE) ×4 IMPLANT
TRAY FOLEY W/BAG SLVR 14FR (SET/KITS/TRAYS/PACK) ×2 IMPLANT
TUBING NON-CON 1/4 X 20 CONN (TUBING) ×2 IMPLANT
YANKAUER SUCT BULB TIP NO VENT (SUCTIONS) ×2 IMPLANT

## 2021-06-30 NOTE — Anesthesia Preprocedure Evaluation (Signed)
Anesthesia Evaluation  Patient identified by MRN, date of birth, ID band Patient awake    Reviewed: Allergy & Precautions, NPO status , Patient's Chart, lab work & pertinent test results  History of Anesthesia Complications Negative for: history of anesthetic complications  Airway Mallampati: III  TM Distance: >3 FB Neck ROM: Full    Dental   Pulmonary neg pulmonary ROS,    Pulmonary exam normal        Cardiovascular + DVT (2020)  Normal cardiovascular exam     Neuro/Psych Depression negative neurological ROS     GI/Hepatic negative GI ROS, Neg liver ROS,   Endo/Other  Morbid obesity  Renal/GU negative Renal ROS  negative genitourinary   Musculoskeletal negative musculoskeletal ROS (+)   Abdominal   Peds  Hematology LOVENOX   Anesthesia Other Findings   Reproductive/Obstetrics (+) Pregnancy Cervical incompetence                            Anesthesia Physical Anesthesia Plan  ASA: 3  Anesthesia Plan: Spinal   Post-op Pain Management: Minimal or no pain anticipated   Induction:   PONV Risk Score and Plan: 2 and Ondansetron and Treatment may vary due to age or medical condition  Airway Management Planned: Natural Airway  Additional Equipment: None  Intra-op Plan:   Post-operative Plan:   Informed Consent: I have reviewed the patients History and Physical, chart, labs and discussed the procedure including the risks, benefits and alternatives for the proposed anesthesia with the patient or authorized representative who has indicated his/her understanding and acceptance.       Plan Discussed with:   Anesthesia Plan Comments:         Anesthesia Quick Evaluation

## 2021-06-30 NOTE — Op Note (Signed)
Operative Note    Preoperative Diagnosis Incompetent cervix with history of cervical fibroid resection Pregnancy at 15+ weeks  Postoperative Diagnosis Same  Procedure McDonald Cerclage with knot at 1 o'clock  Surgeon Paula Compton, MD  Anesthesia Spinal  Fluids: EBL 15mL UOP foley placed after procedure with clear urine noted IVF 772mL   Findings The cervix appeared long and closed.  There was a nabothian cyst on the anterior lip which drained during the procedure   Specimen none  Procedure Note  The patient was taken to the operating room where spinal anesthesia was obtained without difficulty.  She was then prepped and draped in a normal sterile fashion in the dorsal lithotomy position.  An appropriate timeout was performed.  A weighted speculum was placed in the vagina as well as two deaver retractors to expose the cervix.  A pursestring suture of 0 polydek was then placed circumferentially around the cervix.  The knot was tied at 1 o'clock.  At the conclusion of the procedure the vagina was irrigated and no active bleeding noted.  The patient was taken to the recovery room in good condition and FHT's auscultated there were WNL.

## 2021-06-30 NOTE — Transfer of Care (Signed)
Immediate Anesthesia Transfer of Care Note  Patient: Amber Whitehead  Procedure(s) Performed: CERCLAGE CERVICAL  Patient Location: PACU  Anesthesia Type:Spinal  Level of Consciousness: awake, alert  and oriented  Airway & Oxygen Therapy: Patient Spontanous Breathing  Post-op Assessment: Report given to RN and Post -op Vital signs reviewed and stable  Post vital signs: Reviewed and stable  Last Vitals:  Vitals Value Taken Time  BP 137/79 06/30/21 0817  Temp    Pulse 78 06/30/21 0820  Resp 19 06/30/21 0820  SpO2 98 % 06/30/21 0820  Vitals shown include unvalidated device data.  Last Pain:  Vitals:   06/30/21 0545  TempSrc: Oral         Complications: No notable events documented.

## 2021-06-30 NOTE — Anesthesia Procedure Notes (Signed)
Spinal  Patient location during procedure: OR Reason for block: surgical anesthesia Staffing Performed: anesthesiologist  Anesthesiologist: Lidia Collum, MD Preanesthetic Checklist Completed: patient identified, IV checked, risks and benefits discussed, surgical consent, monitors and equipment checked, pre-op evaluation and timeout performed Spinal Block Patient position: sitting Prep: DuraPrep and site prepped and draped Patient monitoring: continuous pulse ox, blood pressure and heart rate Approach: midline Location: L3-4 Injection technique: single-shot Needle Needle type: Pencan  Needle gauge: 24 G Needle length: 10 cm Assessment Events: CSF return Additional Notes Functioning IV was confirmed and monitors were applied. Sterile prep and drape, including hand hygiene and sterile gloves were used. The patient was positioned and the spine was prepped. The skin was anesthetized with lidocaine.  Free flow of clear CSF was obtained prior to injecting local anesthetic into the CSF. The needle was carefully withdrawn. The patient tolerated the procedure well.

## 2021-06-30 NOTE — Anesthesia Postprocedure Evaluation (Signed)
Anesthesia Post Note  Patient: Amber Whitehead  Procedure(s) Performed: CERCLAGE CERVICAL     Patient location during evaluation: PACU Anesthesia Type: Spinal Level of consciousness: oriented and awake and alert Pain management: pain level controlled Vital Signs Assessment: post-procedure vital signs reviewed and stable Respiratory status: spontaneous breathing, respiratory function stable and nonlabored ventilation Cardiovascular status: blood pressure returned to baseline and stable Postop Assessment: no headache, no backache, no apparent nausea or vomiting and spinal receding Anesthetic complications: no   No notable events documented.  Last Vitals:  Vitals:   06/30/21 0845 06/30/21 0900  BP: 113/71 126/72  Pulse: 68 66  Resp: 13 18  Temp:    SpO2: 98% 98%    Last Pain:  Vitals:   06/30/21 0900  TempSrc:   PainSc: 0-No pain   Pain Goal:                   Lidia Collum

## 2021-06-30 NOTE — Interval H&P Note (Signed)
History and Physical Interval Note:  06/30/2021 7:14 AM  Amber Whitehead  has presented today for surgery, with the diagnosis of incompetence of cervix.  The various methods of treatment have been discussed with the patient and family. After consideration of risks, benefits and other options for treatment, the patient has consented to  Procedure(s): CERCLAGE CERVICAL (N/A) as a surgical intervention.  The patient's history has been reviewed, patient examined, no change in status, stable for surgery.  I have reviewed the patient's chart and labs.  Questions were answered to the patient's satisfaction.  SHe has been off her lovenox for more than 24 hours for regional anesthesia.  FHT's WNL at bedside.    Logan Bores

## 2021-07-12 ENCOUNTER — Encounter (HOSPITAL_COMMUNITY): Payer: Self-pay | Admitting: Obstetrics and Gynecology

## 2021-07-12 ENCOUNTER — Inpatient Hospital Stay (HOSPITAL_BASED_OUTPATIENT_CLINIC_OR_DEPARTMENT_OTHER): Payer: BC Managed Care – PPO

## 2021-07-12 ENCOUNTER — Other Ambulatory Visit: Payer: Self-pay

## 2021-07-12 ENCOUNTER — Inpatient Hospital Stay (HOSPITAL_COMMUNITY)
Admission: AD | Admit: 2021-07-12 | Discharge: 2021-07-12 | Disposition: A | Discharge status: Home / Self Care | Payer: BC Managed Care – PPO | Attending: Obstetrics and Gynecology | Admitting: Obstetrics and Gynecology

## 2021-07-12 DIAGNOSIS — O09292 Supervision of pregnancy with other poor reproductive or obstetric history, second trimester: Secondary | ICD-10-CM | POA: Diagnosis not present

## 2021-07-12 DIAGNOSIS — D259 Leiomyoma of uterus, unspecified: Secondary | ICD-10-CM

## 2021-07-12 DIAGNOSIS — O4692 Antepartum hemorrhage, unspecified, second trimester: Secondary | ICD-10-CM

## 2021-07-12 DIAGNOSIS — O3430 Maternal care for cervical incompetence, unspecified trimester: Secondary | ICD-10-CM

## 2021-07-12 DIAGNOSIS — O209 Hemorrhage in early pregnancy, unspecified: Secondary | ICD-10-CM | POA: Diagnosis present

## 2021-07-12 DIAGNOSIS — O09522 Supervision of elderly multigravida, second trimester: Secondary | ICD-10-CM | POA: Diagnosis not present

## 2021-07-12 DIAGNOSIS — Z6790 Unspecified blood type, Rh positive: Secondary | ICD-10-CM

## 2021-07-12 DIAGNOSIS — Z3A17 17 weeks gestation of pregnancy: Secondary | ICD-10-CM | POA: Diagnosis not present

## 2021-07-12 DIAGNOSIS — O3432 Maternal care for cervical incompetence, second trimester: Secondary | ICD-10-CM

## 2021-07-12 DIAGNOSIS — O3412 Maternal care for benign tumor of corpus uteri, second trimester: Secondary | ICD-10-CM

## 2021-07-12 LAB — WET PREP, GENITAL
Clue Cells Wet Prep HPF POC: NONE SEEN
Sperm: NONE SEEN
Trich, Wet Prep: NONE SEEN
WBC, Wet Prep HPF POC: >=10 — AB (ref <10)
Yeast Wet Prep HPF POC: NONE SEEN

## 2021-07-12 LAB — URINALYSIS, MICROSCOPIC (REFLEX)

## 2021-07-12 LAB — URINALYSIS, ROUTINE W REFLEX MICROSCOPIC
Bilirubin Urine: NEGATIVE
Glucose, UA: NEGATIVE mg/dL
Ketones, ur: NEGATIVE mg/dL
Leukocytes,Ua: NEGATIVE
Nitrite: NEGATIVE
Protein, ur: NEGATIVE mg/dL
Specific Gravity, Urine: 1.01 (ref 1.005–1.030)
pH: 6 (ref 5.0–8.0)

## 2021-07-12 NOTE — MAU Provider Note (Addendum)
History     CSN: [id number]  Arrival date and time: 07/12/21 3716   None     Chief Complaint  Patient presents with   Vaginal Bleeding    0545-started    HPI Amber Whitehead is a 41 y.o. G4P0120 at [redacted]w[redacted]d who presents to MAU for vaginal bleeding. Patient is s/p cervical cerclage placement on 06/30/2021. Patient reports she woke up around 0545 this morning and had vaginal bleeding like a period that soaked through her pajama pants. Went to the bathroom and when she wiped she continued to notice bright red bleeding like a period along with a small blood clot. She denies abdominal pain, fever, urinary s/s, leaking fluid. Denies itching/odor, nausea/vomiting, or constipation. She reports she has not had intercourse or any recent vaginal exams.    OB History     Gravida  4   Para  1   Term      Preterm  1   AB  2   Living  0      SAB  2   IAB      Ectopic      Multiple  1   Live Births  1        Obstetric Comments  1st Menstrual Cycle:  8 1st Pregnancy:  1 Twins both passed         Past Medical History:  Diagnosis Date   Acute superficial venous thrombosis of left lower extremity 04/25/2021   left great saphenous vein   Depression    History of premature delivery    of twins   Irritable bowel syndrome (IBS)    Pneumonia 10/2014   Right leg DVT ([place]) 2020   post surgery; took Xarelto for about 3 months after   Seasonal allergies    Uterine fibroid     Past Surgical History:  Procedure Laterality Date   CERVICAL CERCLAGE N/A 06/30/2021   Procedure: CERCLAGE CERVICAL;  Surgeon: Paula Compton, MD;  Location: [facility];  Service: Gynecology;  Laterality: N/A;   ROBOT ASSISTED MYOMECTOMY N/A 07/12/2018   Procedure: XI ROBOTIC ASSISTED LAPAROSCOPIC MYOMECTOMY;  Surgeon: Governor Specking, MD;  Location: [facility];  Service: Gynecology;  Laterality: N/A;   WISDOM TOOTH EXTRACTION      Family History  Problem Relation Age of Onset    Healthy Mother    Healthy Father    Healthy Sister    Lung cancer Maternal Grandmother    Healthy Sister    Breast cancer Neg Hx    Cancer - Colon Neg Hx     Social History   Tobacco Use   Smoking status: Never   Smokeless tobacco: Never  Vaping Use   Vaping Use: Never used  Substance Use Topics   Alcohol use: No   Drug use: No    Allergies: No Known Allergies  Medications Prior to Admission  Medication Sig Dispense Refill Last Dose   acetaminophen (TYLENOL) 325 MG tablet Take 650 mg by mouth every 6 (six) hours as needed for moderate pain or headache.   Past Week   enoxaparin (LOVENOX) 120 MG/0.8ML injection Inject 0.8 mLs (120 mg total) into the skin every 12 (twelve) hours. 48 mL 0 07/11/2021   fluticasone (FLONASE) 50 MCG/ACT nasal spray Place 2 sprays into both nostrils daily as needed for allergies.   07/11/2021   Prenatal Vit-Fe Fumarate-FA (PRENATAL VITAMINS PO) Take 1 tablet by mouth daily.   07/11/2021   sertraline (ZOLOFT) 50 MG tablet Take  50 mg by mouth at bedtime.    07/11/2021    Review of Systems  Constitutional: Negative.   Respiratory: Negative.    Cardiovascular: Negative.   Gastrointestinal: Negative.   Genitourinary:  Positive for vaginal bleeding. Negative for dysuria and pelvic pain.  Musculoskeletal: Negative.   Neurological: Negative.    Physical Exam   Blood pressure (!) 150/82, pulse 87, temperature 98.2 F (36.8 C), temperature source Oral, resp. rate 18, last menstrual period 03/14/2021, SpO2 100 %. BP 138/72 (BP Location: Right Arm)    Pulse 92    Temp 98.2 F (36.8 C) (Oral)    Resp 15    LMP 03/14/2021 (Approximate)    SpO2 99%   Physical Exam Vitals and nursing note reviewed. Exam conducted with a chaperone present.  Constitutional:      General: She is not in acute distress.    Appearance: She is obese.  Eyes:     Extraocular Movements: Extraocular movements intact.     Pupils: Pupils are equal, round, and reactive to light.   Cardiovascular:     Rate and Rhythm: Tachycardia present.  Pulmonary:     Effort: Pulmonary effort is normal.  Abdominal:     Palpations: Abdomen is soft.     Tenderness: There is no abdominal tenderness.  Genitourinary:    Comments: NEFG, vaginal walls pink with rugae, small amount of pink discharge removed with 1 fox swab, no active bleeding, cerclage knot intact in 1 o'clock position, cervix visually closed without lesions/masses Musculoskeletal:        General: Normal range of motion.     Cervical back: Normal range of motion.  Skin:    General: Skin is warm.  Neurological:     General: No focal deficit present.     Mental Status: She is alert and oriented to person, place, and time.  Psychiatric:        Mood and Affect: Mood normal.        Behavior: Behavior normal.        Thought Content: Thought content normal.        Judgment: Judgment normal.  FHT: 155bpm via doppler  Results for orders placed or performed during the hospital encounter of 07/12/21 (from the past 24 hour(s))  Wet prep, genital     Status: Abnormal   Collection Time: 07/12/21  7:40 AM   Specimen: Vaginal  Result Value Ref Range   Yeast Wet Prep HPF POC NONE SEEN NONE SEEN   Trich, Wet Prep NONE SEEN NONE SEEN   Clue Cells Wet Prep HPF POC NONE SEEN NONE SEEN   WBC, Wet Prep HPF POC >=10 (A) <10   Sperm NONE SEEN   Urinalysis, Routine w reflex microscopic Urine, Clean Catch     Status: Abnormal   Collection Time: 07/12/21  8:29 AM  Result Value Ref Range   Color, Urine YELLOW YELLOW   APPearance CLEAR CLEAR   Specific Gravity, Urine 1.010 1.005 - 1.030   pH 6.0 5.0 - 8.0   Glucose, UA NEGATIVE NEGATIVE mg/dL   Hgb urine dipstick LARGE (A) NEGATIVE   Bilirubin Urine NEGATIVE NEGATIVE   Ketones, ur NEGATIVE NEGATIVE mg/dL   Protein, ur NEGATIVE NEGATIVE mg/dL   Nitrite NEGATIVE NEGATIVE   Leukocytes,Ua NEGATIVE NEGATIVE  Urinalysis, Microscopic (reflex)     Status: Abnormal   Collection Time:  07/12/21  8:29 AM  Result Value Ref Range   RBC / HPF 11-20 0 - 5 RBC/hpf   WBC,  UA 0-5 0 - 5 WBC/hpf   Bacteria, UA RARE (A) NONE SEEN   Squamous Epithelial / LPF 0-5 0 - 5   Mucus PRESENT      Media Information  MAU Course  Procedures Wet prep, GC/CT collected [place]  MDM No active bleeding on speculum exam Care handed over to [facility]. Drake Leach, CNM at [address] CNM 07/12/21 8:11 AM  [place] reviewed. Posterior placenta, CL 4.04 cm, cerclage seen, small fibroid in LUS, unclear source of bleeding. Discussed findings with pt and spouse. No bleeding since she's been back from [place]. Stable for discharge home.  Assessment and Plan   1. [redacted] weeks gestation of pregnancy   2. Cervical cerclage suture present   3. Vaginal bleeding before [redacted] weeks gestation   4. Blood type, Rh positive    Discharge home Follow [facility] at [facility] this week as scheduled Pelvic rest Bleeding precautions  Allergies as of 07/12/2021   No Known Allergies      Medication List     TAKE these medications    acetaminophen 325 MG tablet Commonly known as: TYLENOL Take 650 mg by mouth every 6 (six) hours as needed for moderate pain or headache.   enoxaparin 120 MG/0.8ML injection Commonly known as: Lovenox Inject 0.8 mLs (120 mg total) into the skin every 12 (twelve) hours.   fluticasone 50 MCG/ACT nasal spray Commonly known as: FLONASE Place 2 sprays into both nostrils daily as needed for allergies.   PRENATAL VITAMINS PO Take 1 tablet by mouth daily.   sertraline 50 MG tablet Commonly known as: ZOLOFT Take 50 mg by mouth at bedtime.        Julianne Handler, CNM  07/12/2021 9:15 AM

## 2021-07-12 NOTE — MAU Note (Signed)
Amber Whitehead is a 41 y.o. at [redacted]w[redacted]d here in MAU reporting:  painless vaginal bleeding that began this morning. Pt is 2 weeks post cerclage states after going to bathroom-bleeding continued on toilet tissue and she passed approx. 2cm clot. Denies SROM. Pt denies contractions, cramping or pain.   Onset of complaint: 0545 Pain score: 0 Vitals:   07/12/21 0656  BP: (!) 155/94  Pulse: (!) 105  Resp: 18  Temp: 98.2 F (36.8 C)  SpO2: 100%     FHT:

## 2021-07-13 LAB — GC/CHLAMYDIA PROBE AMP (~~LOC~~) NOT AT ARMC
Chlamydia: NEGATIVE
Comment: NEGATIVE
Comment: NORMAL
Neisseria Gonorrhea: NEGATIVE

## 2021-07-18 ENCOUNTER — Inpatient Hospital Stay (HOSPITAL_BASED_OUTPATIENT_CLINIC_OR_DEPARTMENT_OTHER): Payer: BC Managed Care – PPO | Admitting: Oncology

## 2021-07-18 ENCOUNTER — Encounter: Payer: Self-pay | Admitting: Oncology

## 2021-07-18 ENCOUNTER — Inpatient Hospital Stay: Payer: BC Managed Care – PPO | Attending: Oncology

## 2021-07-18 ENCOUNTER — Other Ambulatory Visit: Payer: Self-pay

## 2021-07-18 VITALS — BP 138/89 | HR 88 | Temp 96.8°F | Resp 16 | Wt 249.0 lb

## 2021-07-18 DIAGNOSIS — O99112 Other diseases of the blood and blood-forming organs and certain disorders involving the immune mechanism complicating pregnancy, second trimester: Secondary | ICD-10-CM | POA: Insufficient documentation

## 2021-07-18 DIAGNOSIS — I8002 Phlebitis and thrombophlebitis of superficial vessels of left lower extremity: Secondary | ICD-10-CM

## 2021-07-18 DIAGNOSIS — Z7901 Long term (current) use of anticoagulants: Secondary | ICD-10-CM | POA: Insufficient documentation

## 2021-07-18 DIAGNOSIS — R748 Abnormal levels of other serum enzymes: Secondary | ICD-10-CM

## 2021-07-18 DIAGNOSIS — Z3A18 18 weeks gestation of pregnancy: Secondary | ICD-10-CM | POA: Insufficient documentation

## 2021-07-18 DIAGNOSIS — D6852 Prothrombin gene mutation: Secondary | ICD-10-CM

## 2021-07-18 DIAGNOSIS — O2222 Superficial thrombophlebitis in pregnancy, second trimester: Secondary | ICD-10-CM | POA: Insufficient documentation

## 2021-07-18 DIAGNOSIS — O99282 Endocrine, nutritional and metabolic diseases complicating pregnancy, second trimester: Secondary | ICD-10-CM | POA: Diagnosis not present

## 2021-07-18 DIAGNOSIS — Z86718 Personal history of other venous thrombosis and embolism: Secondary | ICD-10-CM | POA: Diagnosis not present

## 2021-07-18 DIAGNOSIS — E876 Hypokalemia: Secondary | ICD-10-CM | POA: Insufficient documentation

## 2021-07-18 LAB — COMPREHENSIVE METABOLIC PANEL
ALT: 14 U/L (ref 0–44)
AST: 13 U/L — ABNORMAL LOW (ref 15–41)
Albumin: 3.2 g/dL — ABNORMAL LOW (ref 3.5–5.0)
Alkaline Phosphatase: 131 U/L — ABNORMAL HIGH (ref 38–126)
Anion gap: 9 (ref 5–15)
BUN: 11 mg/dL (ref 6–20)
CO2: 24 mmol/L (ref 22–32)
Calcium: 9.1 mg/dL (ref 8.9–10.3)
Chloride: 105 mmol/L (ref 98–111)
Creatinine, Ser: 0.61 mg/dL (ref 0.44–1.00)
GFR, Estimated: >60 mL/min (ref >60)
Glucose, Bld: 91 mg/dL (ref 70–99)
Potassium: 3.4 mmol/L — ABNORMAL LOW (ref 3.5–5.1)
Sodium: 138 mmol/L (ref 135–145)
Total Bilirubin: 0.4 mg/dL (ref 0.3–1.2)
Total Protein: 6.7 g/dL (ref 6.5–8.1)

## 2021-07-18 LAB — CBC WITH DIFFERENTIAL/PLATELET
Abs Immature Granulocytes: 0.04 10*3/uL (ref 0.00–0.07)
Basophils Absolute: 0 10*3/uL (ref 0.0–0.1)
Basophils Relative: 0 %
Eosinophils Absolute: 0.2 10*3/uL (ref 0.0–0.5)
Eosinophils Relative: 2 %
HCT: 35.9 % — ABNORMAL LOW (ref 36.0–46.0)
Hemoglobin: 11.7 g/dL — ABNORMAL LOW (ref 12.0–15.0)
Immature Granulocytes: 1 %
Lymphocytes Relative: 23 %
Lymphs Abs: 2 10*3/uL (ref 0.7–4.0)
MCH: 28.7 pg (ref 26.0–34.0)
MCHC: 32.6 g/dL (ref 30.0–36.0)
MCV: 88 fL (ref 80.0–100.0)
Monocytes Absolute: 0.5 10*3/uL (ref 0.1–1.0)
Monocytes Relative: 6 %
Neutro Abs: 5.8 10*3/uL (ref 1.7–7.7)
Neutrophils Relative %: 68 %
Platelets: 269 10*3/uL (ref 150–400)
RBC: 4.08 MIL/uL (ref 3.87–5.11)
RDW: 14.1 % (ref 11.5–15.5)
WBC: 8.5 10*3/uL (ref 4.0–10.5)
nRBC: 0 % (ref 0.0–0.2)

## 2021-07-18 NOTE — Progress Notes (Signed)
Pt in for follow up, continues on lovenox injections twice a day.  Pt denies any concerns today.

## 2021-07-18 NOTE — Progress Notes (Signed)
Hematology/Oncology Progress note [phone] [phone]      Patient Care Team: Virginia Crews, MD as PCP - General (Family Medicine)  REFERRING PROVIDER: Virginia Crews, MD  CHIEF COMPLAINTS/REASON FOR VISIT:  Follow up for history of DVT, heterozygous prothrombin gene mutation, anticoagulation during pregnancy.  HISTORY OF PRESENTING ILLNESS:   Amber Whitehead is a  41 y.o.  female with PMH listed below was seen in consultation at the request of  Bacigalupo, Dionne Bucy, MD  for evaluation of history of DVT.  Patient developed pain and swelling of right lower extremity.  Patient had recent myomectomy surgery done on 07/02/2018 with immobility at that time. 07/17/2018, unilateral right lower extremity ultrasound showed DVT of the right lower extremity, nonocclusive thrombus in the right common femoral and femoral veins.  Complex solid and cystic mass in the right groin measuring up to 7.4 cm as described.  Nonspecific.   DVT was felt to be provoked by surgery.  Patient completed 3 months of Xarelto.  For right groin mass that was incidentally found on ultrasound 11/14/2018, she had another repeat ultrasound which showed right groin mass increasing size slightly 12/10/2018, CT abdomen pelvis with contrast showed soft tissue attenuation mass measuring approximately 8.1 x 6.7 x 3.6 cm contained within a right femoral hernia.  Nonspecific soft tissue attenuation nodule of left adrenal gland measuring 1.9 cm. 01/01/2019, core biopsy of right groin showed mesothelial lined fibroadipose tissue with mild chronic inflammation and reactive changes.  No evidence of malignancy.  11/3 2021, CT adrenal abdominal without contrast showed stable size of hypodense left adrenal gland nodule. 04/14/2021 CT abdomen pelvis with contrast showed increased size of fluid density collection.  Contained within the otherwise fat-containing right femoral hernia also now with increased  compression of right femoral vein by the hernia and increased inflammatory stranding within the herniated fat.  Leiomyomatous uterus.  Hypodense 1.8 cm left adrenal nodule is stable.  04/25/2021, patient developed acute pain of left lower extremity.   10/31 2022, ultrasound lower extremity left unilateral was positive for superficial venous thrombosis in the left great saphenous vein.  Involving proximal left calf and the distal left thigh.  Negative for DVT in left lower extremity.  Cystic fluid collection in the right groin.  Patient was started on Xarelto 10 mg for treatment of symptomatic SVT with the plan of 45 days duration  04/26/2021, patient was seen by Dr. Christian Mate for right incarcerated femoral hernia.  There is plan for robotic repair.  Surgery was canceled as pregnancy testing was positive.  INTERVAL HISTORY Amber Whitehead is a 41 y.o. female who has above history reviewed by me today presents for follow up visit for anticoagulation, history of DVT, heterozygous prothrombin gene mutation, in pregnancy.  Her anticoagulation has been changed to therapeutics Lovenox 1mg /kg BID.  Patient is currently in her second trimester.  She has lost some weight due to decreased appetite. Early January, patient was due to cervical incompetence.  Patient had cervix cerclage procedure done.  Today she reports feeling well.  No vaginal bleeding or other active bleeding events. Denies lower extremity swelling or pain.  Review of Systems  Constitutional:  Negative for appetite change, chills, fatigue and fever.  HENT:   Negative for hearing loss and voice change.   Eyes:  Negative for eye problems.  Respiratory:  Negative for chest tightness and cough.   Cardiovascular:  Negative for chest pain.  Gastrointestinal:  Negative for abdominal distention, abdominal pain and  blood in stool.  Endocrine: Negative for hot flashes.  Genitourinary:  Negative for difficulty urinating and frequency.    Musculoskeletal:  Negative for arthralgias.       Left lower extremity pain has resolved.   Skin:  Negative for itching and rash.  Neurological:  Negative for extremity weakness.  Hematological:  Negative for adenopathy. Bruises/bleeds easily.  Psychiatric/Behavioral:  Negative for confusion.    MEDICAL HISTORY:  Past Medical History:  Diagnosis Date   Acute superficial venous thrombosis of left lower extremity 04/25/2021   left great saphenous vein   Depression    History of premature delivery    of twins   Irritable bowel syndrome (IBS)    Pneumonia 10/2014   Right leg DVT ([place]) 2020   post surgery; took Xarelto for about 3 months after   Seasonal allergies    Uterine fibroid     SURGICAL HISTORY: Past Surgical History:  Procedure Laterality Date   CERVICAL CERCLAGE N/A 06/30/2021   Procedure: CERCLAGE CERVICAL;  Surgeon: Paula Compton, MD;  Location: [facility];  Service: Gynecology;  Laterality: N/A;   ROBOT ASSISTED MYOMECTOMY N/A 07/12/2018   Procedure: XI ROBOTIC ASSISTED LAPAROSCOPIC MYOMECTOMY;  Surgeon: Governor Specking, MD;  Location: [facility];  Service: Gynecology;  Laterality: N/A;   WISDOM TOOTH EXTRACTION      SOCIAL HISTORY: Social History   Socioeconomic History   Marital status: Married    Spouse name: Jonathon   Number of children: Not on file   Years of education: Not on file   Highest education level: Not on file  Occupational History   Not on file  Tobacco Use   Smoking status: Never   Smokeless tobacco: Never  Vaping Use   Vaping Use: Never used  Substance and Sexual Activity   Alcohol use: No   Drug use: No   Sexual activity: Not Currently    Birth control/protection: Condom  Other Topics Concern   Not on file  Social History Narrative   Not on file   Social Determinants of Health   Financial Resource Strain: Not on file  Food Insecurity: Not on file  Transportation Needs: Not on file  Physical Activity: Not  on file  Stress: Not on file  Social Connections: Not on file  Intimate Partner Violence: Not on file    FAMILY HISTORY: Family History  Problem Relation Age of Onset   Healthy Mother    Healthy Father    Healthy Sister    Lung cancer Maternal Grandmother    Healthy Sister    Breast cancer Neg Hx    Cancer - Colon Neg Hx     ALLERGIES:  has No Known Allergies.  MEDICATIONS:  Current Outpatient Medications  Medication Sig Dispense Refill   acetaminophen (TYLENOL) 325 MG tablet Take 650 mg by mouth every 6 (six) hours as needed for moderate pain or headache.     enoxaparin (LOVENOX) 120 MG/0.8ML injection Inject 0.8 mLs (120 mg total) into the skin every 12 (twelve) hours. 48 mL 0   fluticasone (FLONASE) 50 MCG/ACT nasal spray Place 2 sprays into both nostrils daily as needed for allergies.     Prenatal Vit-Fe Fumarate-FA (PRENATAL VITAMINS PO) Take 1 tablet by mouth daily.     progesterone (PROMETRIUM) 200 MG capsule Place 200 mg vaginally daily.     sertraline (ZOLOFT) 50 MG tablet Take 50 mg by mouth at bedtime.      No current facility-administered medications for this  visit.     PHYSICAL EXAMINATION: ECOG PERFORMANCE STATUS: 1 - Symptomatic but completely ambulatory Vitals:   07/18/21 1430  BP: 138/89  Pulse: 88  Resp: 16  Temp: (!) 96.8 F (36 C)  SpO2: 100%   Filed Weights   07/18/21 1430  Weight: 249 lb (112.9 kg)    Physical Exam Constitutional:      General: She is not in acute distress.    Appearance: She is obese.  HENT:     Head: Normocephalic and atraumatic.  Eyes:     General: No scleral icterus. Cardiovascular:     Rate and Rhythm: Normal rate and regular rhythm.     Heart sounds: Normal heart sounds.  Pulmonary:     Effort: Pulmonary effort is normal. No respiratory distress.     Breath sounds: No wheezing.  Abdominal:     Palpations: Abdomen is soft.  Musculoskeletal:        General: No deformity. Normal range of motion.     Cervical  back: Normal range of motion and neck supple.  Skin:    General: Skin is warm and dry.     Findings: Bruising present. No erythema or rash.  Neurological:     Mental Status: She is alert and oriented to person, place, and time. Mental status is at baseline.     Cranial Nerves: No cranial nerve deficit.     Coordination: Coordination normal.  Psychiatric:        Mood and Affect: Mood normal.    LABORATORY DATA:  I have reviewed the data as listed Lab Results  Component Value Date   WBC 8.5 07/18/2021   HGB 11.7 (L) 07/18/2021   HCT 35.9 (L) 07/18/2021   MCV 88.0 07/18/2021   PLT 269 07/18/2021   Recent Labs    05/03/21 0833 07/18/21 1414  NA 135 138  K 4.2 3.4*  CL 101 105  CO2 20 24  GLUCOSE 89 91  BUN 9 11  CREATININE 0.66 0.61  CALCIUM 8.9 9.1  GFRNONAA  --  >60  PROT 6.1 6.7  ALBUMIN 3.7* 3.2*  AST 12 13*  ALT 12 14  ALKPHOS 62 131*  BILITOT 0.3 0.4    Iron/TIBC/Ferritin/ %Sat No results found for: IRON, TIBC, FERRITIN, IRONPCTSAT    RADIOGRAPHIC STUDIES: I have personally reviewed the radiological images as listed and agreed with the findings in the report. [place] MFM OB Transvaginal  Result Date: 07/12/2021 ----------------------------------------------------------------------  OBSTETRICS REPORT                       (Signed Final 07/12/2021 12:27 pm) ---------------------------------------------------------------------- Patient Info  ID #:       [id number]                          D.O.B.:  1981/01/28 (40 yrs)  Name:       Amber Whitehead             Visit Date: 07/12/2021 07:45 am ---------------------------------------------------------------------- Performed By  Attending:        Sander Nephew      Secondary Phy.:   Eartha Inch                    MD  SIMPSON  Performed By:     Georgie Chard        [facility]                    [place]  Referred By:      [facility] MAU/Triage ---------------------------------------------------------------------- Orders  #  Description                           Code        Ordered By  1  [place] MFM OB LIMITED                     76815.01    [place]  2  [place] MFM OB TRANSVAGINAL                [id number]     DANIELLE SIMPSON ----------------------------------------------------------------------  #  Order #                     Accession #                Episode #  1  [id number]                   [id number]                 [id number]  2  [id number]                   [id number]                 [id number] ---------------------------------------------------------------------- Indications  Vaginal bleeding in pregnancy, second          O46.92  trimester  Encounter for cervical length                  Z36.86  [redacted] weeks gestation of pregnancy                Z3A.17  Advanced maternal age multigravida 71+,        O80.522  second trimester (41 y.o.)  Poor obstetric history: Previous preterm       O09.219  delivery, antepartum ---------------------------------------------------------------------- Fetal Evaluation  Num Of Fetuses:         1  Fetal Heart Rate(bpm):  166  Cardiac Activity:       Observed  Presentation:           Breech  Placenta:               Posterior  P. Cord Insertion:      Marginal insertion  Amniotic Fluid  AFI FV:      Within normal limits                              Largest Pocket(cm)                              3.4  Comment:    No placental abruption or previa identified. ---------------------------------------------------------------------- Biometry  LV:        6.3  mm ----------------------------------------------------------------------  OB History  Gravidity:    4         Term:   0        Prem:   1        SAB:   2  TOP:          0       Ectopic:  0        Living: 1 ---------------------------------------------------------------------- Gestational Age  LMP:           17w 1d        Date:  03/14/21                  EDD:   12/19/21  Best:          17w 1d     Det. By:  LMP  (03/14/21)          EDD:   12/19/21 ---------------------------------------------------------------------- Anatomy  Cranium:               Visualized             Heart:                  Visualized  Ventricles:            Visualized             Stomach:                Visualized  Choroid Plexus:        Visualized             Abdomen:                Visualized  Thoracic:              Visualized  Other:  Technically difficult due to fetal position. ---------------------------------------------------------------------- Cervix Uterus Adnexa  Cervix  Length:           4.04  cm.  Normal appearance by transvaginal scan Cerclage visualized.  Measured transvaginally.  Uterus  No abnormality visualized.  Right Ovary  Not visualized.  Left Ovary  Not visualized.  Cul De Sac  No free fluid seen. ---------------------------------------------------------------------- Myomas  Site                     L(cm)      W(cm)      D(cm)       Location  Anterior                 2.9        2          2.7  Anterior                 3.3        3.2        3.2  LUS                      2.7        2.1        1.8  LUS                      3.1        1.9        1.7 ----------------------------------------------------------------------  Blood Flow                  RI       PI  Comments ---------------------------------------------------------------------- Impression  Limited exam due to maternal vaginal bleeding.  Ms. Giebler has a cerclage in place as well.  There is good fetal movement and amniotic fluid.  No evidence of placenta previa or abruption.  Cerclage is visualized. ---------------------------------------------------------------------- Recommendations  Clinical correlation recommended. ----------------------------------------------------------------------               Sander Nephew, MD Electronically Signed Final Report   07/12/2021 12:27 pm  ----------------------------------------------------------------------  [place] MFM OB LIMITED  Result Date: 07/12/2021 ----------------------------------------------------------------------  OBSTETRICS REPORT                       (Signed Final 07/12/2021 12:27 pm) ---------------------------------------------------------------------- Patient Info  ID #:       [id number]                          D.O.B.:  06-Apr-1981 (40 yrs)  Name:       Amber Whitehead             Visit Date: 07/12/2021 07:45 am ---------------------------------------------------------------------- Performed By  Attending:        Sander Nephew      Secondary Phy.:   Eartha Inch                    MD                                                             [place]  Performed By:     Georgie Chard        Location:         [facility]                    [place]  Referred By:      [facility] MAU/Triage ---------------------------------------------------------------------- Orders  #  Description                           Code        Ordered By  1  [place] MFM OB LIMITED                     02774.12    DANIELLE SIMPSON  2  [place] MFM OB TRANSVAGINAL                87867.6     DANIELLE SIMPSON ----------------------------------------------------------------------  #  Order #                     Accession #                Episode #  1  [id number]                   [id number]                 [id number]  2  [id number]  [id number]                 [id number] ---------------------------------------------------------------------- Indications  Vaginal bleeding in pregnancy, second          O46.92  trimester  Encounter for cervical length                  Z36.86  [redacted] weeks gestation of pregnancy                Z3A.17  Advanced maternal age multigravida 25+,        O29.522  second trimester (41 y.o.)  Poor obstetric history: Previous preterm       O09.219  delivery, antepartum  ---------------------------------------------------------------------- Fetal Evaluation  Num Of Fetuses:         1  Fetal Heart Rate(bpm):  166  Cardiac Activity:       Observed  Presentation:           Breech  Placenta:               Posterior  P. Cord Insertion:      Marginal insertion  Amniotic Fluid  AFI FV:      Within normal limits                              Largest Pocket(cm)                              3.4  Comment:    No placental abruption or previa identified. ---------------------------------------------------------------------- Biometry  LV:        6.3  mm ---------------------------------------------------------------------- OB History  Gravidity:    4         Term:   0        Prem:   1        SAB:   2  TOP:          0       Ectopic:  0        Living: 1 ---------------------------------------------------------------------- Gestational Age  LMP:           17w 1d        Date:  03/14/21                 EDD:   12/19/21  Best:          17w 1d     Det. By:  LMP  (03/14/21)          EDD:   12/19/21 ---------------------------------------------------------------------- Anatomy  Cranium:               Visualized             Heart:                  Visualized  Ventricles:            Visualized             Stomach:                Visualized  Choroid Plexus:        Visualized             Abdomen:                Visualized  Thoracic:              Visualized  Other:  Technically difficult due to fetal position. ---------------------------------------------------------------------- Cervix Uterus Adnexa  Cervix  Length:           4.04  cm.  Normal appearance by transvaginal scan Cerclage visualized.  Measured transvaginally.  Uterus  No abnormality visualized.  Right Ovary  Not visualized.  Left Ovary  Not visualized.  Cul De Sac  No free fluid seen. ---------------------------------------------------------------------- Myomas  Site                     L(cm)      W(cm)      D(cm)       Location  Anterior                  2.9        2          2.7  Anterior                 3.3        3.2        3.2  LUS                      2.7        2.1        1.8  LUS                      3.1        1.9        1.7 ----------------------------------------------------------------------  Blood Flow                  RI       PI       Comments ---------------------------------------------------------------------- Impression  Limited exam due to maternal vaginal bleeding.  Ms. Kattner has a cerclage in place as well.  There is good fetal movement and amniotic fluid.  No evidence of placenta previa or abruption.  Cerclage is visualized. ---------------------------------------------------------------------- Recommendations  Clinical correlation recommended. ----------------------------------------------------------------------               Sander Nephew, MD Electronically Signed Final Report   07/12/2021 12:27 pm ----------------------------------------------------------------------     ASSESSMENT & PLAN:  1. Prothrombin gene mutation ([place])   2. History of deep vein thrombosis (DVT) of lower extremity   3. [redacted] weeks gestation of pregnancy   4. Thrombophlebitis of superficial veins of left lower extremity   5. Alkaline phosphatase elevation    #Symptomatic superficial thrombosis of left lower extremity, history of provoked DVT. Heterozygous prothrombin gene mutation, second trimester pregnancy. other risk factors including history of DVT, age >35, obesity. Continue Lovenox 1 mg/kilogram twice daily.-Prescription was managed by her OB/GYN. Labs are reviewed and discussed with patient.  Hemoglobins are stable.  #Increased alkaline phosphatase level likely due to pregnancy.  I asked patient to update this lab findings to her OB/GYN. #Mild hypokalemia, potassium 3.4.  Encourage patient to increase oral intake of potassium rich food.  Orders Placed This Encounter  Procedures   Comprehensive metabolic panel    Standing Status:    Future    Standing Expiration Date:   07/18/2022   CBC with Differential/Platelet    Standing Status:   Future    Standing Expiration Date:   07/18/2022    All questions were answered. The patient knows to call the clinic with any problems questions or concerns.  cc Virginia Crews, MD    Return of visit: 3 months repeat CBC  CMP.   Earlie Server, MD, PhD  07/18/2021

## 2021-10-17 ENCOUNTER — Inpatient Hospital Stay (HOSPITAL_BASED_OUTPATIENT_CLINIC_OR_DEPARTMENT_OTHER): Payer: BC Managed Care – PPO | Admitting: Oncology

## 2021-10-17 ENCOUNTER — Encounter: Payer: Self-pay | Admitting: Oncology

## 2021-10-17 ENCOUNTER — Other Ambulatory Visit: Payer: Self-pay

## 2021-10-17 ENCOUNTER — Inpatient Hospital Stay: Payer: BC Managed Care – PPO | Attending: Oncology

## 2021-10-17 VITALS — BP 122/90 | HR 83 | Temp 97.9°F | Resp 18 | Wt 248.0 lb

## 2021-10-17 DIAGNOSIS — Z86718 Personal history of other venous thrombosis and embolism: Secondary | ICD-10-CM | POA: Diagnosis not present

## 2021-10-17 DIAGNOSIS — I8002 Phlebitis and thrombophlebitis of superficial vessels of left lower extremity: Secondary | ICD-10-CM | POA: Diagnosis not present

## 2021-10-17 DIAGNOSIS — O99613 Diseases of the digestive system complicating pregnancy, third trimester: Secondary | ICD-10-CM | POA: Diagnosis not present

## 2021-10-17 DIAGNOSIS — D508 Other iron deficiency anemias: Secondary | ICD-10-CM | POA: Insufficient documentation

## 2021-10-17 DIAGNOSIS — D6852 Prothrombin gene mutation: Secondary | ICD-10-CM | POA: Diagnosis not present

## 2021-10-17 DIAGNOSIS — Z7901 Long term (current) use of anticoagulants: Secondary | ICD-10-CM | POA: Insufficient documentation

## 2021-10-17 DIAGNOSIS — Z79899 Other long term (current) drug therapy: Secondary | ICD-10-CM | POA: Insufficient documentation

## 2021-10-17 LAB — CBC WITH DIFFERENTIAL/PLATELET
Abs Immature Granulocytes: 0.1 10*3/uL — ABNORMAL HIGH (ref 0.00–0.07)
Basophils Absolute: 0.1 10*3/uL (ref 0.0–0.1)
Basophils Relative: 1 %
Eosinophils Absolute: 0.1 10*3/uL (ref 0.0–0.5)
Eosinophils Relative: 1 %
HCT: 33.1 % — ABNORMAL LOW (ref 36.0–46.0)
Hemoglobin: 10.6 g/dL — ABNORMAL LOW (ref 12.0–15.0)
Immature Granulocytes: 1 %
Lymphocytes Relative: 21 %
Lymphs Abs: 2 10*3/uL (ref 0.7–4.0)
MCH: 27.7 pg (ref 26.0–34.0)
MCHC: 32 g/dL (ref 30.0–36.0)
MCV: 86.4 fL (ref 80.0–100.0)
Monocytes Absolute: 0.6 10*3/uL (ref 0.1–1.0)
Monocytes Relative: 7 %
Neutro Abs: 6.5 10*3/uL (ref 1.7–7.7)
Neutrophils Relative %: 69 %
Platelets: 200 10*3/uL (ref 150–400)
RBC: 3.83 MIL/uL — ABNORMAL LOW (ref 3.87–5.11)
RDW: 14.2 % (ref 11.5–15.5)
WBC: 9.4 10*3/uL (ref 4.0–10.5)
nRBC: 0.6 % — ABNORMAL HIGH (ref 0.0–0.2)

## 2021-10-17 LAB — IRON AND TIBC
Iron: 47 ug/dL (ref 28–170)
Saturation Ratios: 5 % — ABNORMAL LOW (ref 10.4–31.8)
TIBC: 872 ug/dL — ABNORMAL HIGH (ref 250–450)
UIBC: 825 ug/dL

## 2021-10-17 LAB — RETIC PANEL
Immature Retic Fract: 32.1 % — ABNORMAL HIGH (ref 2.3–15.9)
RBC.: 3.83 MIL/uL — ABNORMAL LOW (ref 3.87–5.11)
Retic Count, Absolute: 105.7 10*3/uL (ref 19.0–186.0)
Retic Ct Pct: 2.8 % (ref 0.4–3.1)
Reticulocyte Hemoglobin: 24.6 pg — ABNORMAL LOW (ref >27.9)

## 2021-10-17 LAB — COMPREHENSIVE METABOLIC PANEL
ALT: 19 U/L (ref 0–44)
AST: 16 U/L (ref 15–41)
Albumin: 2.9 g/dL — ABNORMAL LOW (ref 3.5–5.0)
Alkaline Phosphatase: 352 U/L — ABNORMAL HIGH (ref 38–126)
Anion gap: 5 (ref 5–15)
BUN: 23 mg/dL — ABNORMAL HIGH (ref 6–20)
CO2: 22 mmol/L (ref 22–32)
Calcium: 9.6 mg/dL (ref 8.9–10.3)
Chloride: 105 mmol/L (ref 98–111)
Creatinine, Ser: 0.75 mg/dL (ref 0.44–1.00)
GFR, Estimated: >60 mL/min (ref >60)
Glucose, Bld: 91 mg/dL (ref 70–99)
Potassium: 4.2 mmol/L (ref 3.5–5.1)
Sodium: 132 mmol/L — ABNORMAL LOW (ref 135–145)
Total Bilirubin: 0.4 mg/dL (ref 0.3–1.2)
Total Protein: 6.5 g/dL (ref 6.5–8.1)

## 2021-10-17 LAB — FERRITIN: Ferritin: 7 ng/mL — ABNORMAL LOW (ref 11–307)

## 2021-10-17 LAB — TECHNOLOGIST SMEAR REVIEW: Plt Morphology: ADEQUATE

## 2021-10-17 MED ORDER — FERROUS SULFATE 325 (65 FE) MG PO TBEC: 325.0000 mg | DELAYED_RELEASE_TABLET | Freq: Two times a day (BID) | ORAL | 1 refills | Status: DC

## 2021-10-17 NOTE — Progress Notes (Signed)
?Hematology/Oncology Progress note ?[phone] [phone] ?  ? ?   ? ? ?Patient Care Team: ?Virginia Crews, MD as PCP - General (Family Medicine) ? ?REFERRING PROVIDER: ?Virginia Crews, MD  ?CHIEF COMPLAINTS/REASON FOR VISIT:  ?Follow up for history of DVT, heterozygous prothrombin gene mutation, anticoagulation during pregnancy. ? ?HISTORY OF PRESENTING ILLNESS:  ? ?Amber Whitehead is a  41 y.o.  female with PMH listed below was seen in consultation at the request of  Bacigalupo, Dionne Bucy, MD  for evaluation of history of DVT. ? ?Patient developed pain and swelling of right lower extremity.  Patient had recent myomectomy surgery done on 07/02/2018 with immobility at that time. ?07/17/2018, unilateral right lower extremity ultrasound showed DVT of the right lower extremity, nonocclusive thrombus in the right common femoral and femoral veins.  Complex solid and cystic mass in the right groin measuring up to 7.4 cm as described.  Nonspecific.   DVT was felt to be provoked by surgery.  Patient completed 3 months of Xarelto. ? ?For right groin mass that was incidentally found on ultrasound ?11/14/2018, she had another repeat ultrasound which showed right groin mass increasing size slightly ?12/10/2018, CT abdomen pelvis with contrast showed soft tissue attenuation mass measuring approximately 8.1 x 6.7 x 3.6 cm contained within a right femoral hernia.  Nonspecific soft tissue attenuation nodule of left adrenal gland measuring 1.9 cm. ?01/01/2019, core biopsy of right groin showed mesothelial lined fibroadipose tissue with mild chronic inflammation and reactive changes.  No evidence of malignancy. ? ?11/3 2021, CT adrenal abdominal without contrast showed stable size of hypodense left adrenal gland nodule. ?04/14/2021 CT abdomen pelvis with contrast showed increased size of fluid density collection.  Contained within the otherwise fat-containing right femoral hernia also now with increased  compression of right femoral vein by the hernia and increased inflammatory stranding within the herniated fat.  Leiomyomatous uterus.  Hypodense 1.8 cm left adrenal nodule is stable. ? ?04/25/2021, patient developed acute pain of left lower extremity.   ?10/31 2022, ultrasound lower extremity left unilateral was positive for superficial venous thrombosis in the left great saphenous vein.  Involving proximal left calf and the distal left thigh.  Negative for DVT in left lower extremity.  Cystic fluid collection in the right groin.  Patient was started on Xarelto 10 mg for treatment of symptomatic SVT with the plan of 45 days duration ? ?04/26/2021, patient was seen by Dr. Christian Mate for right incarcerated femoral hernia.  There is plan for robotic repair.  Surgery was canceled as pregnancy testing was positive. ? ?INTERVAL HISTORY ?Amber Whitehead is a 41 y.o. female who has above history reviewed by me today presents for follow up visit for anticoagulation, history of DVT, heterozygous prothrombin gene mutation, in pregnancy.  ?Patient is currently in third trimester.  Due date in June 2023. ?She is on anticoagulation with Lovenox 1 mg/kg twice daily managed by OB/GYN. ?Weight has been stable.  Today she denies any lower extremity swelling or pain. ?Manageable injection sites side effects. ?Review of Systems  ?Constitutional:  Negative for appetite change, chills, fatigue and fever.  ?HENT:   Negative for hearing loss and voice change.   ?Eyes:  Negative for eye problems.  ?Respiratory:  Negative for chest tightness and cough.   ?Cardiovascular:  Negative for chest pain.  ?Gastrointestinal:  Negative for abdominal distention, abdominal pain and blood in stool.  ?Endocrine: Negative for hot flashes.  ?Genitourinary:  Negative for difficulty urinating and frequency.   ?  Musculoskeletal:  Negative for arthralgias.  ?Skin:  Negative for itching and rash.  ?Neurological:  Negative for extremity weakness.   ?Hematological:  Negative for adenopathy. Bruises/bleeds easily.  ?Psychiatric/Behavioral:  Negative for confusion.   ? ?MEDICAL HISTORY:  ?Past Medical History:  ?Diagnosis Date  ? Acute superficial venous thrombosis of left lower extremity 04/25/2021  ? left great saphenous vein  ? Depression   ? History of premature delivery   ? of twins  ? Irritable bowel syndrome (IBS)   ? Pneumonia 10/2014  ? Right leg DVT ([place]) 2020  ? post surgery; took Xarelto for about 3 months after  ? Seasonal allergies   ? Uterine fibroid   ? ? ?SURGICAL HISTORY: ?Past Surgical History:  ?Procedure Laterality Date  ? CERVICAL CERCLAGE N/A 06/30/2021  ? Procedure: CERCLAGE CERVICAL;  Surgeon: Paula Compton, MD;  Location: [facility];  Service: Gynecology;  Laterality: N/A;  ? ROBOT ASSISTED MYOMECTOMY N/A 07/12/2018  ? Procedure: XI ROBOTIC ASSISTED LAPAROSCOPIC MYOMECTOMY;  Surgeon: Governor Specking, MD;  Location: [facility];  Service: Gynecology;  Laterality: N/A;  ? WISDOM TOOTH EXTRACTION    ? ? ?SOCIAL HISTORY: ?Social History  ? ?Socioeconomic History  ? Marital status: Married  ?  Spouse name: Angelica Chessman  ? Number of children: Not on file  ? Years of education: Not on file  ? Highest education level: Not on file  ?Occupational History  ? Not on file  ?Tobacco Use  ? Smoking status: Never  ? Smokeless tobacco: Never  ?Vaping Use  ? Vaping Use: Never used  ?Substance and Sexual Activity  ? Alcohol use: No  ? Drug use: No  ? Sexual activity: Not Currently  ?  Birth control/protection: Condom  ?Other Topics Concern  ? Not on file  ?Social History Narrative  ? Not on file  ? ?Social Determinants of Health  ? ?Financial Resource Strain: Not on file  ?Food Insecurity: Not on file  ?Transportation Needs: Not on file  ?Physical Activity: Not on file  ?Stress: Not on file  ?Social Connections: Not on file  ?Intimate Partner Violence: Not on file  ? ? ?FAMILY HISTORY: ?Family History  ?Problem Relation Age of Onset  ?  Healthy Mother   ? Healthy Father   ? Healthy Sister   ? Lung cancer Maternal Grandmother   ? Healthy Sister   ? Breast cancer Neg Hx   ? Cancer - Colon Neg Hx   ? ? ?ALLERGIES:  has No Known Allergies. ? ?MEDICATIONS:  ?Current Outpatient Medications  ?Medication Sig Dispense Refill  ? acetaminophen (TYLENOL) 325 MG tablet Take 650 mg by mouth every 6 (six) hours as needed for moderate pain or headache.    ? enoxaparin (LOVENOX) 120 MG/0.8ML injection Inject 0.8 mLs (120 mg total) into the skin every 12 (twelve) hours. 48 mL 0  ? fluticasone (FLONASE) 50 MCG/ACT nasal spray Place 2 sprays into both nostrils daily as needed for allergies.    ? NIFEdipine (PROCARDIA-XL/NIFEDICAL-XL) 30 MG 24 hr tablet Take 30 mg by mouth daily.    ? Prenatal Vit-Fe Fumarate-FA (PRENATAL VITAMINS PO) Take 1 tablet by mouth daily.    ? progesterone (PROMETRIUM) 200 MG capsule Place 200 mg vaginally daily.    ? sertraline (ZOLOFT) 50 MG tablet Take 50 mg by mouth at bedtime.     ? ?No current facility-administered medications for this visit.  ? ? ? ?PHYSICAL EXAMINATION: ?ECOG PERFORMANCE STATUS: 1 - Symptomatic but completely ambulatory ?  Vitals:  ? 10/17/21 1322  ?BP: 122/90  ?Pulse: 83  ?Resp: 18  ?Temp: 97.9 ?F (36.6 ?C)  ? ?Filed Weights  ? 10/17/21 1322  ?Weight: 248 lb (112.5 kg)  ? ? ?Physical Exam ?Constitutional:   ?   General: She is not in acute distress. ?   Appearance: She is obese.  ?HENT:  ?   Head: Normocephalic and atraumatic.  ?Eyes:  ?   General: No scleral icterus. ?Cardiovascular:  ?   Rate and Rhythm: Normal rate and regular rhythm.  ?   Heart sounds: Normal heart sounds.  ?Pulmonary:  ?   Effort: Pulmonary effort is normal. No respiratory distress.  ?   Breath sounds: No wheezing.  ?Abdominal:  ?   Palpations: Abdomen is soft.  ?Musculoskeletal:     ?   General: No deformity. Normal range of motion.  ?   Cervical back: Normal range of motion and neck supple.  ?Skin: ?   General: Skin is warm and dry.  ?    Findings: Bruising present. No erythema or rash.  ?Neurological:  ?   Mental Status: She is alert and oriented to person, place, and time. Mental status is at baseline.  ?   Cranial Nerves: No cranial nerve deficit.  ?   Coordin

## 2021-10-17 NOTE — Addendum Note (Signed)
Addended by: Earlie Server on: 10/17/2021 10:34 PM ? ? Modules accepted: Orders ? ?

## 2021-10-17 NOTE — Progress Notes (Signed)
Patient here for follow up. Pt is currently [redacted] weeks pregnant. No new concerns voiced.  ?

## 2021-10-18 ENCOUNTER — Telehealth: Payer: Self-pay

## 2021-10-18 DIAGNOSIS — I8002 Phlebitis and thrombophlebitis of superficial vessels of left lower extremity: Secondary | ICD-10-CM

## 2021-10-18 DIAGNOSIS — E611 Iron deficiency: Secondary | ICD-10-CM

## 2021-10-18 NOTE — Telephone Encounter (Signed)
Error

## 2021-10-18 NOTE — Telephone Encounter (Signed)
Spoke with patient and informed her of lab results and Dr. Collie Siad recommendation to start Ferrous Sulfate 325 mg bid with meal. Also, advised to take OTC stool softener (colace 100 mg) daily as needed for constipation. Informed patient we will follow up with labs in 4 weeks. Patient verbalized understanding.  ? ? ?Please schedule for: ?Labs in 4 weeks. ?Please notify patient of appt. Thanks  ?

## 2021-10-18 NOTE — Telephone Encounter (Signed)
-----   Message from Earlie Server, MD sent at 10/17/2021 10:35 PM EDT ----- ?Please let patient know that her iron level is low.  I recommend patient to take ferrous sulfate 325 mg twice daily with meals.  Prescription was sent to her pharmacy.  If she experiences constipation, recommend over-the-counter stool softener Colace 100 mg daily. ?Please add lab encounter in 4 weeks, CBC iron TIBC ferritin. ?

## 2021-11-09 ENCOUNTER — Other Ambulatory Visit: Payer: Self-pay | Admitting: Oncology

## 2021-11-09 ENCOUNTER — Encounter: Payer: Self-pay | Admitting: Oncology

## 2021-11-09 NOTE — Telephone Encounter (Signed)
Please advise 

## 2021-11-15 ENCOUNTER — Inpatient Hospital Stay: Payer: BC Managed Care – PPO | Attending: Oncology

## 2021-11-15 DIAGNOSIS — O99613 Diseases of the digestive system complicating pregnancy, third trimester: Secondary | ICD-10-CM | POA: Insufficient documentation

## 2021-11-15 DIAGNOSIS — E611 Iron deficiency: Secondary | ICD-10-CM

## 2021-11-15 DIAGNOSIS — Z7901 Long term (current) use of anticoagulants: Secondary | ICD-10-CM | POA: Insufficient documentation

## 2021-11-15 DIAGNOSIS — D508 Other iron deficiency anemias: Secondary | ICD-10-CM | POA: Insufficient documentation

## 2021-11-15 DIAGNOSIS — Z79899 Other long term (current) drug therapy: Secondary | ICD-10-CM | POA: Diagnosis not present

## 2021-11-15 LAB — IRON AND TIBC
Iron: 210 ug/dL — ABNORMAL HIGH (ref 28–170)
Saturation Ratios: 27 % (ref 10.4–31.8)
TIBC: 790 ug/dL — ABNORMAL HIGH (ref 250–450)
UIBC: 580 ug/dL

## 2021-11-15 LAB — CBC WITH DIFFERENTIAL/PLATELET
Abs Immature Granulocytes: 0.09 10*3/uL — ABNORMAL HIGH (ref 0.00–0.07)
Basophils Absolute: 0 10*3/uL (ref 0.0–0.1)
Basophils Relative: 1 %
Eosinophils Absolute: 0.1 10*3/uL (ref 0.0–0.5)
Eosinophils Relative: 1 %
HCT: 34.2 % — ABNORMAL LOW (ref 36.0–46.0)
Hemoglobin: 10.6 g/dL — ABNORMAL LOW (ref 12.0–15.0)
Immature Granulocytes: 1 %
Lymphocytes Relative: 26 %
Lymphs Abs: 1.9 10*3/uL (ref 0.7–4.0)
MCH: 27.2 pg (ref 26.0–34.0)
MCHC: 31 g/dL (ref 30.0–36.0)
MCV: 87.7 fL (ref 80.0–100.0)
Monocytes Absolute: 0.5 10*3/uL (ref 0.1–1.0)
Monocytes Relative: 7 %
Neutro Abs: 4.6 10*3/uL (ref 1.7–7.7)
Neutrophils Relative %: 64 %
Platelets: 180 10*3/uL (ref 150–400)
RBC: 3.9 MIL/uL (ref 3.87–5.11)
RDW: 16.3 % — ABNORMAL HIGH (ref 11.5–15.5)
WBC: 7.1 10*3/uL (ref 4.0–10.5)
nRBC: 1.3 % — ABNORMAL HIGH (ref 0.0–0.2)

## 2021-11-15 LAB — FERRITIN: Ferritin: 12 ng/mL (ref 11–307)

## 2021-11-16 ENCOUNTER — Encounter (HOSPITAL_COMMUNITY): Payer: Self-pay

## 2021-11-16 NOTE — Patient Instructions (Signed)
Amber Whitehead  11/16/2021   Your procedure is scheduled on:  11/30/2021  Arrive at [facility] [place] [facility] [place] on [address] at [facility]  and [address]. You are invited to use the FREE valet parking or use the Visitor's parking deck.  Pick up the phone at the desk and dial [phone].  Call this number if you have problems the morning of surgery: [phone]  Remember:   Do not eat food:(After Midnight) Desps de medianoche.  Do not drink clear liquids: (After Midnight) Desps de medianoche.  Take these medicines the morning of surgery with A SIP OF WATER:  Take your AM Lovenox before 0830 on 6/6.  Do not take it that night or the morning of surgery. Take Nifedipine as prescribed   Do not wear jewelry, make-up or nail polish.  Do not wear lotions, powders, or perfumes. Do not wear deodorant.  Do not shave 48 hours prior to surgery.  Do not bring valuables to the hospital.  [facility] is not   responsible for any belongings or valuables brought to the hospital.  Contacts, dentures or bridgework may not be worn into surgery.  Leave suitcase in the car. After surgery it may be brought to your room.  For patients admitted to the hospital, checkout time is 11:00 AM the day of              discharge.      Please read over the following fact sheets that you were given:     Preparing for Surgery

## 2021-11-18 ENCOUNTER — Encounter: Payer: Self-pay | Admitting: Oncology

## 2021-11-18 NOTE — Telephone Encounter (Signed)
Please advise 

## 2021-11-23 NOTE — Telephone Encounter (Signed)
Please see pt's message.

## 2021-11-24 NOTE — Telephone Encounter (Signed)
Please move up appts to mid June and inform pt of updated appts:   Labs 1-2 days prior to MD.

## 2021-11-28 ENCOUNTER — Encounter (HOSPITAL_COMMUNITY)
Admission: RE | Admit: 2021-11-28 | Discharge: 2021-11-28 | Disposition: A | Discharge status: Home / Self Care | Payer: BC Managed Care – PPO | Source: Ambulatory Visit | Attending: Family Medicine | Admitting: Family Medicine

## 2021-11-28 DIAGNOSIS — Z01812 Encounter for preprocedural laboratory examination: Secondary | ICD-10-CM | POA: Insufficient documentation

## 2021-11-28 DIAGNOSIS — O3429 Maternal care due to uterine scar from other previous surgery: Secondary | ICD-10-CM | POA: Insufficient documentation

## 2021-11-28 HISTORY — DX: Gestational diabetes mellitus in pregnancy, unspecified control: O24.419

## 2021-11-28 HISTORY — DX: Gestational (pregnancy-induced) hypertension without significant proteinuria, unspecified trimester: O13.9

## 2021-11-28 LAB — BASIC METABOLIC PANEL
Anion gap: 7 (ref 5–15)
BUN: 14 mg/dL (ref 6–20)
CO2: 20 mmol/L — ABNORMAL LOW (ref 22–32)
Calcium: 8.7 mg/dL — ABNORMAL LOW (ref 8.9–10.3)
Chloride: 111 mmol/L (ref 98–111)
Creatinine, Ser: 0.91 mg/dL (ref 0.44–1.00)
GFR, Estimated: >60 mL/min (ref >60)
Glucose, Bld: 120 mg/dL — ABNORMAL HIGH (ref 70–99)
Potassium: 4.3 mmol/L (ref 3.5–5.1)
Sodium: 138 mmol/L (ref 135–145)

## 2021-11-28 LAB — RPR: RPR Ser Ql: NONREACTIVE

## 2021-11-28 LAB — CBC
HCT: 35.2 % — ABNORMAL LOW (ref 36.0–46.0)
Hemoglobin: 11 g/dL — ABNORMAL LOW (ref 12.0–15.0)
MCH: 28.5 pg (ref 26.0–34.0)
MCHC: 31.3 g/dL (ref 30.0–36.0)
MCV: 91.2 fL (ref 80.0–100.0)
Platelets: 172 10*3/uL (ref 150–400)
RBC: 3.86 MIL/uL — ABNORMAL LOW (ref 3.87–5.11)
RDW: 17.8 % — ABNORMAL HIGH (ref 11.5–15.5)
WBC: 6.8 10*3/uL (ref 4.0–10.5)
nRBC: 0.6 % — ABNORMAL HIGH (ref 0.0–0.2)

## 2021-11-28 LAB — TYPE AND SCREEN
ABO/RH(D): B POS
Antibody Screen: NEGATIVE

## 2021-11-29 NOTE — H&P (Signed)
Amber Whitehead is a 41 y.o. female G4P0120 at 5 0/7 weeks (EDD 12/21/21 by LMP c/w 8 week [place]) presenting for scheduled c-section at 31 0/7 weeks for a prior myomectomy as well as multiple risk factors in pregnancy. Prenatal care significant for:  1) Depressive disorder  stable on zoloft 2) Cervical incompetence  Dr. Darreld Mclean removed a large anterior cervical fibroid in Oct 07, 2018 Prophylactic cerclage done at 13+ weeks 3) Deep venous thrombosis  h/o DVT post myomectomy and pt has a mutation in prothrombin gene.   Diagnosed with a superficial blood clot in left leg just after conception On therapeutic lovenox '120mg'$  twice daily- followed by hematology Continue lovenox '1mg'$ /kg BID postpartum x 6 weeks 4) Inguinal hernia  has an enlarging right hernia, was for surgery and found out day of she was pregnant, delayed until pp 58) Advanced maternal age gravida   NIPT inadequate x 3 likely due to the lovenox per genetic counseling Anatomy US WNL so pt declines amnio or further testing 6) Obesity  BMI 83 Growth [place] at 8, 60 and 36 weeks WNL 7) History of premature delivery  24 week twins delivered 10-07-2011 One neonatal death and the other died at 68 years 84fage with severe delays Progesterone at 176 weeks8 ) History of myomectomy  204-13-2020Robotic myomectomy with Dr. YKerin PernaMultiple fibroids and one cervical fibroid removed Needs c-section at 34weeks  9) Hypertensive disorder  Started on procardia XL '30mg'$  po q AM at 23 weeks Start BPP's at 351weeks 10) Gestational diabetes mellitus   Diet controlled OB History     Gravida  4   Para  1   Term      Preterm  1   AB  2   Living  0      SAB  2   IAB      Ectopic      Multiple  1   Live Births  1        Obstetric Comments  1st Menstrual Cycle:  8 1st Pregnancy:  89Twins both passed       03-26-2012, 24 wks NSVD  twins  SAB x 2  Past Medical History:  Diagnosis Date   Acute superficial venous thrombosis of left lower  extremity 04/25/2021   left great saphenous vein   Depression    Gestational diabetes    History of premature delivery    of twins   Irritable bowel syndrome (IBS)    Pneumonia 10/2014   Pregnancy induced hypertension    Right leg DVT ([place] 22020/04/13  post surgery; took Xarelto for about 3 months after   Seasonal allergies    Uterine fibroid    Past Surgical History:  Procedure Laterality Date   CERVICAL CERCLAGE N/A 06/30/2021   Procedure: CERCLAGE CERVICAL;  Surgeon: RPaula Compton MD;  Location: [facility];  Service: Gynecology;  Laterality: N/A;   ROBOT ASSISTED MYOMECTOMY N/A 07/12/2018   Procedure: XI ROBOTIC ASSISTED LAPAROSCOPIC MYOMECTOMY;  Surgeon: YGovernor Specking MD;  [facility]  Service: Gynecology;  Laterality: N/A;   WISDOM TOOTH EXTRACTION     Family History: family history includes Healthy in her father, mother, sister, and sister; Lung cancer in her maternal grandmother. Social History:  reports that she has never smoked. She has never used smokeless tobacco. She reports that she does not drink alcohol and does not use drugs.     Maternal Diabetes: Yes:  Diabetes Type:  Diet  controlled Genetic Screening: Declined Maternal Ultrasounds/Referrals: Normal Fetal Ultrasounds or other Referrals:  None Maternal Substance Abuse:  No Significant Maternal Medications:  Meds include: Other: Procardia, zoloft Significant Maternal Lab Results:  Group B Strep negative Other Comments:  None  Review of Systems  Constitutional:  Negative for fever.  Gastrointestinal:  Negative for abdominal pain.  Genitourinary:  Negative for vaginal bleeding.  Maternal Medical History:  Contractions: Frequency: rare.   Perceived severity is mild.   Fetal activity: Perceived fetal activity is normal.   Prenatal complications: PIH.   See HPI Prenatal Complications - Diabetes: gestational. Diabetes is managed by diet.      Last menstrual period  03/14/2021. Maternal Exam:  Uterine Assessment: Contraction strength is mild.  Contraction frequency is irregular.  Abdomen: Patient reports no abdominal tenderness. Fetal presentation: vertex Introitus: Normal vulva.  Physical Exam Constitutional:      Appearance: Normal appearance.  Cardiovascular:     Rate and Rhythm: Normal rate and regular rhythm.  Pulmonary:     Effort: Pulmonary effort is normal.  Abdominal:     Palpations: Abdomen is soft.  Genitourinary:    General: Normal vulva.     Rectum: Normal.  Skin:    General: Skin is warm.  Neurological:     Mental Status: She is alert.    Prenatal labs: ABO, Rh: --/--/B POS (06/05 8676) Antibody: NEG (06/05 1950) Rubella: Nonimmune (11/30 0000) RPR: NON REACTIVE (06/05 0921)  HBsAg: Negative (11/30 0000)  HIV: Non-reactive (11/30 0000)  GBS:   Negative NIPT inconclusive  Assessment/Plan:   d/w pt c-section and cerclage removal in detail and reviewed risks of bleeding, infection and possible damage to bowel and bladder.  Pt knows will come off lovenox> 24 hours prior to c-section so last dose 530a  11/29/21  Cervix soft but cerclage intact. She declines tubal sterilization.  Logan Bores 11/29/2021, 9:08 PM

## 2021-11-30 ENCOUNTER — Inpatient Hospital Stay (HOSPITAL_COMMUNITY): Payer: BC Managed Care – PPO | Admitting: Anesthesiology

## 2021-11-30 ENCOUNTER — Other Ambulatory Visit: Payer: Self-pay

## 2021-11-30 ENCOUNTER — Inpatient Hospital Stay (HOSPITAL_COMMUNITY)
Admission: RE | Admit: 2021-11-30 | Discharge: 2021-12-03 | DRG: 786 | Disposition: A | Discharge status: Home / Self Care | Payer: BC Managed Care – PPO | Source: Ambulatory Visit | Attending: Obstetrics and Gynecology | Admitting: Obstetrics and Gynecology

## 2021-11-30 ENCOUNTER — Encounter (HOSPITAL_COMMUNITY)
Admission: RE | Disposition: A | Discharge status: Home / Self Care | Payer: Self-pay | Source: Ambulatory Visit | Attending: Obstetrics and Gynecology

## 2021-11-30 ENCOUNTER — Encounter (HOSPITAL_COMMUNITY): Payer: Self-pay | Admitting: Obstetrics and Gynecology

## 2021-11-30 DIAGNOSIS — O24420 Gestational diabetes mellitus in childbirth, diet controlled: Secondary | ICD-10-CM | POA: Diagnosis present

## 2021-11-30 DIAGNOSIS — O3433 Maternal care for cervical incompetence, third trimester: Secondary | ICD-10-CM | POA: Diagnosis present

## 2021-11-30 DIAGNOSIS — Z23 Encounter for immunization: Secondary | ICD-10-CM | POA: Diagnosis not present

## 2021-11-30 DIAGNOSIS — D259 Leiomyoma of uterus, unspecified: Secondary | ICD-10-CM | POA: Diagnosis present

## 2021-11-30 DIAGNOSIS — O3413 Maternal care for benign tumor of corpus uteri, third trimester: Secondary | ICD-10-CM | POA: Diagnosis present

## 2021-11-30 DIAGNOSIS — O99344 Other mental disorders complicating childbirth: Secondary | ICD-10-CM | POA: Diagnosis present

## 2021-11-30 DIAGNOSIS — O99214 Obesity complicating childbirth: Secondary | ICD-10-CM | POA: Diagnosis present

## 2021-11-30 DIAGNOSIS — F32A Depression, unspecified: Secondary | ICD-10-CM | POA: Diagnosis present

## 2021-11-30 DIAGNOSIS — O3429 Maternal care due to uterine scar from other previous surgery: Secondary | ICD-10-CM | POA: Diagnosis present

## 2021-11-30 DIAGNOSIS — O1002 Pre-existing essential hypertension complicating childbirth: Secondary | ICD-10-CM | POA: Diagnosis present

## 2021-11-30 DIAGNOSIS — Z86718 Personal history of other venous thrombosis and embolism: Secondary | ICD-10-CM

## 2021-11-30 DIAGNOSIS — Z3A37 37 weeks gestation of pregnancy: Secondary | ICD-10-CM | POA: Diagnosis not present

## 2021-11-30 DIAGNOSIS — Z98891 History of uterine scar from previous surgery: Secondary | ICD-10-CM

## 2021-11-30 LAB — GLUCOSE, CAPILLARY
Glucose-Capillary: 76 mg/dL (ref 70–99)
Glucose-Capillary: 85 mg/dL (ref 70–99)

## 2021-11-30 SURGERY — Surgical Case
Anesthesia: Spinal

## 2021-11-30 MED ORDER — MEPERIDINE HCL 25 MG/ML IJ SOLN: 6.2500 mg | INTRAMUSCULAR | Status: DC | PRN

## 2021-11-30 MED ORDER — DIPHENHYDRAMINE HCL 50 MG/ML IJ SOLN: 12.5000 mg | INTRAMUSCULAR | Status: DC | PRN

## 2021-11-30 MED ORDER — CEFAZOLIN SODIUM-DEXTROSE 2-4 GM/100ML-% IV SOLN
INTRAVENOUS | Status: AC
  Filled 2021-11-30: qty 100

## 2021-11-30 MED ORDER — LACTATED RINGERS IV SOLN: INTRAVENOUS | Status: DC

## 2021-11-30 MED ORDER — MORPHINE SULFATE (PF) 0.5 MG/ML IJ SOLN
INTRAMUSCULAR | Status: DC | PRN
  Administered 2021-11-30: 150 ug via INTRATHECAL

## 2021-11-30 MED ORDER — PHENYLEPHRINE 80 MCG/ML (10ML) SYRINGE FOR IV PUSH (FOR BLOOD PRESSURE SUPPORT)
PREFILLED_SYRINGE | INTRAVENOUS | Status: AC
Start: 2021-11-30 — End: ?
  Filled 2021-11-30: qty 10

## 2021-11-30 MED ORDER — OXYCODONE HCL 5 MG/5ML PO SOLN: 5.0000 mg | Freq: Once | ORAL | Status: DC | PRN

## 2021-11-30 MED ORDER — SERTRALINE HCL 50 MG PO TABS
50.0000 mg | ORAL_TABLET | Freq: Every day | ORAL | Status: DC
Start: 2021-11-30 — End: 2021-12-03
  Administered 2021-11-30 – 2021-12-02 (×3): 50 mg via ORAL
  Filled 2021-11-30 (×3): qty 1

## 2021-11-30 MED ORDER — PHENYLEPHRINE HCL-NACL 20-0.9 MG/250ML-% IV SOLN
INTRAVENOUS | Status: AC
  Filled 2021-11-30: qty 250

## 2021-11-30 MED ORDER — HYDROMORPHONE HCL 1 MG/ML IJ SOLN: 0.2500 mg | INTRAMUSCULAR | Status: DC | PRN

## 2021-11-30 MED ORDER — SIMETHICONE 80 MG PO CHEW
80.0000 mg | CHEWABLE_TABLET | Freq: Three times a day (TID) | ORAL | Status: DC
  Administered 2021-11-30 – 2021-12-03 (×7): 80 mg via ORAL
  Filled 2021-11-30 (×7): qty 1

## 2021-11-30 MED ORDER — ONDANSETRON HCL 4 MG/2ML IJ SOLN: 4.0000 mg | Freq: Three times a day (TID) | INTRAMUSCULAR | Status: DC | PRN

## 2021-11-30 MED ORDER — ZOLPIDEM TARTRATE 5 MG PO TABS: 5.0000 mg | ORAL_TABLET | Freq: Every evening | ORAL | Status: DC | PRN

## 2021-11-30 MED ORDER — CEFAZOLIN SODIUM-DEXTROSE 2-4 GM/100ML-% IV SOLN
2.0000 g | INTRAVENOUS | Status: AC
  Administered 2021-11-30: 2 g via INTRAVENOUS

## 2021-11-30 MED ORDER — STERILE WATER FOR IRRIGATION IR SOLN
Status: DC | PRN
  Administered 2021-11-30: 1

## 2021-11-30 MED ORDER — OXYTOCIN-SODIUM CHLORIDE 30-0.9 UT/500ML-% IV SOLN
2.5000 [IU]/h | INTRAVENOUS | Status: AC
  Administered 2021-11-30: 2.5 [IU]/h via INTRAVENOUS
  Filled 2021-11-30: qty 500

## 2021-11-30 MED ORDER — OXYCODONE HCL 5 MG PO TABS
5.0000 mg | ORAL_TABLET | Freq: Once | ORAL | Status: AC
  Administered 2021-12-03: 5 mg via ORAL

## 2021-11-30 MED ORDER — DEXAMETHASONE SODIUM PHOSPHATE 4 MG/ML IJ SOLN
INTRAMUSCULAR | Status: DC | PRN
  Administered 2021-11-30: 8 mg via INTRAVENOUS

## 2021-11-30 MED ORDER — ACETAMINOPHEN 10 MG/ML IV SOLN
INTRAVENOUS | Status: DC | PRN
  Administered 2021-11-30: 1000 mg via INTRAVENOUS

## 2021-11-30 MED ORDER — SCOPOLAMINE 1 MG/3DAYS TD PT72
MEDICATED_PATCH | TRANSDERMAL | Status: AC
  Filled 2021-11-30: qty 1

## 2021-11-30 MED ORDER — NALOXONE HCL 4 MG/10ML IJ SOLN: 1.0000 ug/kg/h | INTRAVENOUS | Status: DC | PRN

## 2021-11-30 MED ORDER — SCOPOLAMINE 1 MG/3DAYS TD PT72
MEDICATED_PATCH | TRANSDERMAL | Status: DC | PRN
  Administered 2021-11-30: 1 via TRANSDERMAL

## 2021-11-30 MED ORDER — COCONUT OIL OIL: 1.0000 "application " | TOPICAL_OIL | Status: DC | PRN

## 2021-11-30 MED ORDER — SODIUM CHLORIDE 0.9% FLUSH: 3.0000 mL | INTRAVENOUS | Status: DC | PRN

## 2021-11-30 MED ORDER — KETOROLAC TROMETHAMINE 30 MG/ML IJ SOLN
INTRAMUSCULAR | Status: AC
  Filled 2021-11-30: qty 1

## 2021-11-30 MED ORDER — WITCH HAZEL-GLYCERIN EX PADS: 1.0000 "application " | MEDICATED_PAD | CUTANEOUS | Status: DC | PRN

## 2021-11-30 MED ORDER — PHENYLEPHRINE HCL-NACL 20-0.9 MG/250ML-% IV SOLN
INTRAVENOUS | Status: DC | PRN
  Administered 2021-11-30: 60 ug/min via INTRAVENOUS

## 2021-11-30 MED ORDER — BUPIVACAINE IN DEXTROSE 0.75-8.25 % IT SOLN
INTRATHECAL | Status: DC | PRN
  Administered 2021-11-30: 1.6 mL via INTRATHECAL

## 2021-11-30 MED ORDER — NALOXONE HCL 0.4 MG/ML IJ SOLN: 0.4000 mg | INTRAMUSCULAR | Status: DC | PRN

## 2021-11-30 MED ORDER — ENOXAPARIN SODIUM 120 MG/0.8ML IJ SOSY
120.0000 mg | PREFILLED_SYRINGE | Freq: Two times a day (BID) | INTRAMUSCULAR | Status: DC
  Administered 2021-12-01 – 2021-12-03 (×5): 120 mg via SUBCUTANEOUS
  Filled 2021-11-30 (×7): qty 0.8

## 2021-11-30 MED ORDER — IBUPROFEN 600 MG PO TABS
600.0000 mg | ORAL_TABLET | Freq: Four times a day (QID) | ORAL | Status: DC
  Administered 2021-11-30 – 2021-12-03 (×11): 600 mg via ORAL
  Filled 2021-11-30 (×11): qty 1

## 2021-11-30 MED ORDER — KETOROLAC TROMETHAMINE 30 MG/ML IJ SOLN
30.0000 mg | Freq: Four times a day (QID) | INTRAMUSCULAR | Status: AC | PRN
  Administered 2021-11-30 (×2): 30 mg via INTRAVENOUS
  Filled 2021-11-30: qty 1

## 2021-11-30 MED ORDER — MENTHOL 3 MG MT LOZG: 1.0000 | LOZENGE | OROMUCOSAL | Status: DC | PRN

## 2021-11-30 MED ORDER — ACETAMINOPHEN 500 MG PO TABS
1000.0000 mg | ORAL_TABLET | Freq: Four times a day (QID) | ORAL | Status: DC
  Administered 2021-11-30 – 2021-12-03 (×11): 1000 mg via ORAL
  Filled 2021-11-30 (×11): qty 2

## 2021-11-30 MED ORDER — METOCLOPRAMIDE HCL 5 MG/ML IJ SOLN
INTRAMUSCULAR | Status: AC
  Filled 2021-11-30: qty 2

## 2021-11-30 MED ORDER — MORPHINE SULFATE (PF) 0.5 MG/ML IJ SOLN
INTRAMUSCULAR | Status: AC
  Filled 2021-11-30: qty 10

## 2021-11-30 MED ORDER — OXYCODONE HCL 5 MG PO TABS
5.0000 mg | ORAL_TABLET | ORAL | Status: DC | PRN
  Administered 2021-11-30 – 2021-12-03 (×7): 5 mg via ORAL
  Filled 2021-11-30 (×8): qty 1

## 2021-11-30 MED ORDER — OXYCODONE HCL 5 MG PO TABS: 5.0000 mg | ORAL_TABLET | Freq: Once | ORAL | Status: DC | PRN

## 2021-11-30 MED ORDER — DIPHENHYDRAMINE HCL 25 MG PO CAPS: 25.0000 mg | ORAL_CAPSULE | ORAL | Status: DC | PRN

## 2021-11-30 MED ORDER — DEXAMETHASONE SODIUM PHOSPHATE 4 MG/ML IJ SOLN
INTRAMUSCULAR | Status: AC
  Filled 2021-11-30: qty 2

## 2021-11-30 MED ORDER — SENNOSIDES-DOCUSATE SODIUM 8.6-50 MG PO TABS
2.0000 | ORAL_TABLET | Freq: Every day | ORAL | Status: DC
  Administered 2021-12-01 – 2021-12-03 (×3): 2 via ORAL
  Filled 2021-11-30 (×3): qty 2

## 2021-11-30 MED ORDER — KETOROLAC TROMETHAMINE 30 MG/ML IJ SOLN: 30.0000 mg | Freq: Four times a day (QID) | INTRAMUSCULAR | Status: AC | PRN

## 2021-11-30 MED ORDER — OXYTOCIN-SODIUM CHLORIDE 30-0.9 UT/500ML-% IV SOLN
INTRAVENOUS | Status: AC
  Filled 2021-11-30: qty 500

## 2021-11-30 MED ORDER — POVIDONE-IODINE 10 % EX SWAB
2.0000 "application " | Freq: Once | CUTANEOUS | Status: AC
  Administered 2021-11-30: 2 via TOPICAL

## 2021-11-30 MED ORDER — ACETAMINOPHEN 10 MG/ML IV SOLN: 1000.0000 mg | Freq: Once | INTRAVENOUS | Status: DC | PRN

## 2021-11-30 MED ORDER — FENTANYL CITRATE (PF) 100 MCG/2ML IJ SOLN
INTRAMUSCULAR | Status: DC | PRN
Start: 2021-11-30 — End: 2021-11-30
  Administered 2021-11-30: 15 ug via INTRATHECAL

## 2021-11-30 MED ORDER — FENTANYL CITRATE (PF) 100 MCG/2ML IJ SOLN
INTRAMUSCULAR | Status: AC
  Filled 2021-11-30: qty 2

## 2021-11-30 MED ORDER — FLUTICASONE PROPIONATE 50 MCG/ACT NA SUSP: 2.0000 | Freq: Every day | NASAL | Status: DC | PRN

## 2021-11-30 MED ORDER — ACETAMINOPHEN 10 MG/ML IV SOLN
INTRAVENOUS | Status: AC
  Filled 2021-11-30: qty 100

## 2021-11-30 MED ORDER — TETANUS-DIPHTH-ACELL PERTUSSIS 5-2.5-18.5 LF-MCG/0.5 IM SUSY: 0.5000 mL | PREFILLED_SYRINGE | Freq: Once | INTRAMUSCULAR | Status: DC

## 2021-11-30 MED ORDER — ONDANSETRON HCL 4 MG/2ML IJ SOLN
INTRAMUSCULAR | Status: AC
  Filled 2021-11-30: qty 2

## 2021-11-30 MED ORDER — PRENATAL MULTIVITAMIN CH
1.0000 | ORAL_TABLET | Freq: Every day | ORAL | Status: DC
  Administered 2021-12-01 – 2021-12-03 (×3): 1 via ORAL
  Filled 2021-11-30 (×3): qty 1

## 2021-11-30 MED ORDER — SCOPOLAMINE 1 MG/3DAYS TD PT72: 1.0000 | MEDICATED_PATCH | Freq: Once | TRANSDERMAL | Status: DC

## 2021-11-30 MED ORDER — PROMETHAZINE HCL 25 MG/ML IJ SOLN: 6.2500 mg | INTRAMUSCULAR | Status: DC | PRN

## 2021-11-30 MED ORDER — SIMETHICONE 80 MG PO CHEW
80.0000 mg | CHEWABLE_TABLET | ORAL | Status: DC | PRN
  Administered 2021-12-02: 80 mg via ORAL
  Filled 2021-11-30: qty 1

## 2021-11-30 MED ORDER — DIPHENHYDRAMINE HCL 25 MG PO CAPS: 25.0000 mg | ORAL_CAPSULE | Freq: Four times a day (QID) | ORAL | Status: DC | PRN

## 2021-11-30 MED ORDER — ONDANSETRON HCL 4 MG/2ML IJ SOLN
INTRAMUSCULAR | Status: DC | PRN
  Administered 2021-11-30: 4 mg via INTRAVENOUS

## 2021-11-30 MED ORDER — OXYTOCIN-SODIUM CHLORIDE 30-0.9 UT/500ML-% IV SOLN
INTRAVENOUS | Status: DC | PRN
  Administered 2021-11-30: 300 mL via INTRAVENOUS

## 2021-11-30 MED ORDER — DIBUCAINE (PERIANAL) 1 % EX OINT: 1.0000 "application " | TOPICAL_OINTMENT | CUTANEOUS | Status: DC | PRN

## 2021-11-30 MED ORDER — NIFEDIPINE ER OSMOTIC RELEASE 30 MG PO TB24
30.0000 mg | ORAL_TABLET | Freq: Every day | ORAL | Status: DC
  Administered 2021-12-01 – 2021-12-02 (×2): 30 mg via ORAL
  Filled 2021-11-30 (×3): qty 1

## 2021-11-30 SURGICAL SUPPLY — 36 items
APL SKNCLS STERI-STRIP NONHPOA (GAUZE/BANDAGES/DRESSINGS) ×1
BENZOIN TINCTURE PRP APPL 2/3 (GAUZE/BANDAGES/DRESSINGS) ×1 IMPLANT
CHLORAPREP W/TINT 26ML (MISCELLANEOUS) ×4 IMPLANT
CLAMP CORD UMBIL (MISCELLANEOUS) ×2 IMPLANT
CLOSURE STERI STRIP 1/2 X4 (GAUZE/BANDAGES/DRESSINGS) ×1 IMPLANT
CLOTH BEACON ORANGE TIMEOUT ST (SAFETY) ×2 IMPLANT
DRSG OPSITE POSTOP 4X10 (GAUZE/BANDAGES/DRESSINGS) ×2 IMPLANT
ELECT REM PT RETURN 9FT ADLT (ELECTROSURGICAL) ×2
ELECTRODE REM PT RTRN 9FT ADLT (ELECTROSURGICAL) ×1 IMPLANT
EXTRACTOR VACUUM KIWI (MISCELLANEOUS) IMPLANT
GLOVE BIO SURGEON STRL SZ 6.5 (GLOVE) ×2 IMPLANT
GLOVE BIOGEL PI IND STRL 7.0 (GLOVE) ×1 IMPLANT
GLOVE BIOGEL PI INDICATOR 7.0 (GLOVE) ×1
GOWN STRL REUS W/TWL LRG LVL3 (GOWN DISPOSABLE) ×4 IMPLANT
KIT ABG SYR 3ML LUER SLIP (SYRINGE) IMPLANT
MAT PREVALON FULL STRYKER (MISCELLANEOUS) ×1 IMPLANT
NDL HYPO 25X5/8 SAFETYGLIDE (NEEDLE) IMPLANT
NEEDLE HYPO 25X5/8 SAFETYGLIDE (NEEDLE) IMPLANT
NS IRRIG 1000ML POUR BTL (IV SOLUTION) ×2 IMPLANT
PACK C SECTION WH (CUSTOM PROCEDURE TRAY) ×2 IMPLANT
PAD OB MATERNITY 4.3X12.25 (PERSONAL CARE ITEMS) ×2 IMPLANT
RTRCTR C-SECT PINK 25CM LRG (MISCELLANEOUS) ×2 IMPLANT
STRIP CLOSURE SKIN 1/2X4 (GAUZE/BANDAGES/DRESSINGS) IMPLANT
SUT CHROMIC 1 CTX 36 (SUTURE) ×4 IMPLANT
SUT PLAIN 0 NONE (SUTURE) IMPLANT
SUT PLAIN 2 0 XLH (SUTURE) ×2 IMPLANT
SUT VIC AB 0 CT1 27 (SUTURE) ×4
SUT VIC AB 0 CT1 27XBRD ANBCTR (SUTURE) ×2 IMPLANT
SUT VIC AB 2-0 CT1 27 (SUTURE) ×2
SUT VIC AB 2-0 CT1 TAPERPNT 27 (SUTURE) ×1 IMPLANT
SUT VIC AB 3-0 CT1 27 (SUTURE)
SUT VIC AB 3-0 CT1 TAPERPNT 27 (SUTURE) IMPLANT
SUT VIC AB 4-0 KS 27 (SUTURE) ×2 IMPLANT
TOWEL OR 17X24 6PK STRL BLUE (TOWEL DISPOSABLE) ×2 IMPLANT
TRAY FOLEY W/BAG SLVR 14FR LF (SET/KITS/TRAYS/PACK) ×2 IMPLANT
WATER STERILE IRR 1000ML POUR (IV SOLUTION) ×2 IMPLANT

## 2021-11-30 NOTE — Interval H&P Note (Signed)
History and Physical Interval Note:  11/30/2021 8:12 AM  Amber Whitehead  has presented today for surgery, with the diagnosis of 37 weeks, history of myomectomy.  The various methods of treatment have been discussed with the patient and family. After consideration of risks, benefits and other options for treatment, the patient has consented to  Procedure(s): CESAREAN SECTION (N/A) and cerclage removal as a surgical intervention.  The patient's history has been reviewed, patient examined, no change in status, stable for surgery.  I have reviewed the patient's chart and labs.  Questions were answered to the patient's satisfaction.     Logan Bores

## 2021-11-30 NOTE — Progress Notes (Signed)
Inpatient Diabetes Program Recommendations  AACE/ADA: New Consensus Statement on Inpatient Glycemic Control (2015)  Target Ranges:  Prepandial:   less than 140 mg/dL      Peak postprandial:   less than 180 mg/dL (1-2 hours)      Critically ill patients:  140 - 180 mg/dL   Lab Results  Component Value Date   GLUCAP 76 11/30/2021    Review of Glycemic Control  Latest Reference Range & Units 11/30/21 07:00  Glucose-Capillary 70 - 99 mg/dL 76   Diabetes history: GDM Outpatient Diabetes medications: None-Diet controlled Current orders for Inpatient glycemic control: none   Received consult for this patient.  She is S/P C/S this morning.  Current glucose is 76 mg/dL.  She is diet controlled at home.  She should follow up with PCP or OB-GYN regularly as she is at increased risk for developing type 2 diabetes.    Will continue to follow while inpatient.  Thank you, Reche Dixon, MSN, [place] Diabetes Coordinator Inpatient Diabetes Program [phone] (team pager from 8a-5p)

## 2021-11-30 NOTE — Op Note (Signed)
Operative Note    Preoperative Diagnosis Term pregnancy at 37 0/7 weeks Prior myomectomy Incompetent cervix CHTN Advanced maternal age Gestational diabetes  Postoperative Diagnosis same  Procedure Primary low transverse c-section with two layer closure of uterus  Cerclage removal  Surgeon Paula Compton, MD  Anesthesia Spinal  Fluids: EBL 450m UOP 1040mclear IVF 180014mR  Findings There was a viable female infant in the vertex presentation, apgars 8,9 weight pending.  Uterus had several fibroids, one anterior fundal (3cm) and a larger upper left fundal near the cornua.  Tubes and ovaries WNL.   Specimen Placenta to L&D  Procedure Note  Patient was taken to the operating room where spinal anesthesia was obtained and found to be adequate by Allis clamp test. A speculum was placed and the cerclage grasped with ring forcep and removed with scissors. She was prepped and draped in the normal sterile fashion in the dorsal supine position with a leftward tilt. An appropriate time out was performed. A Pfannenstiel skin incision was then made with the scalpel and carried through to the underlying layer of fascia by sharp dissection and Bovie cautery. The fascia was nicked in the midline and the incision was extended laterally with Mayo scissors. The inferior aspect of the incision was grasped Coker clamps and dissected off the underlying rectus muscles. In a similar fashion the superior aspect was dissected off the rectus muscles. Rectus muscles were separated in the midline and the peritoneal cavity entered bluntly. The peritoneal incision was then extended both superiorly and inferiorly with careful attention to avoid both bowel and bladder. The Alexis self-retaining wound retractor was then placed within the incision and the lower uterine segment exposed. The bladder flap was developed with Metzenbaum scissors and pushed away from the lower uterine segment. The lower uterine  segment was then incised in a transverse fashion and the cavity itself entered bluntly. The incision was extended bluntly. The infant's head was then lifted and delivered from the incision without difficulty. The remainder of the infant delivered and the nose and mouth bulb suctioned with the cord clamped and cut as well after a one minute delay.  The infant was handed off to the waiting pediatricians. The placenta was then spontaneously expressed from the uterus and the uterus cleared of all clots and debris with moist lap sponge. The uterine incision was then repaired in 2 layers the first layer was a running locked layer 1-0 chromic and the second an imbricating layer of the same suture. The tubes and ovaries were inspected and the gutters cleared of all clots and debris. The uterine incision was inspected and found to be hemostatic. All instruments and sponges as well as the Alexis retractor were then removed from the abdomen. The rectus muscles and peritoneum were then reapproximated with a running suture of 2-0 Vicryl. The fascia was then closed with 0 Vicryl in a running fashion. Subcutaneous tissue was reapproximated with 3-0 plain in a running fashion. The skin was closed with a subcuticular stitch of 4-0 Vicryl on a Keith needle and then reinforced with benzoin and Steri-Strips. At the conclusion of the procedure all instruments and sponge counts were correct. Patient was taken to the recovery room in good condition with her baby accompanying her skin to skin.

## 2021-11-30 NOTE — Anesthesia Postprocedure Evaluation (Signed)
Anesthesia Post Note  Patient: Amber Whitehead  Procedure(s) Performed: CESAREAN SECTION     Patient location during evaluation: PACU Anesthesia Type: Spinal Level of consciousness: oriented and awake and alert Pain management: pain level controlled Vital Signs Assessment: post-procedure vital signs reviewed and stable Respiratory status: spontaneous breathing, respiratory function stable and nonlabored ventilation Cardiovascular status: blood pressure returned to baseline and stable Postop Assessment: no headache, no backache, no apparent nausea or vomiting and spinal receding Anesthetic complications: no   No notable events documented.  Last Vitals:  Vitals:   11/30/21 1230 11/30/21 1340  BP: 125/77   Pulse: (!) 55   Resp: 18 18  Temp: 36.7 C   SpO2: 98% 98%    Last Pain:  Vitals:   11/30/21 1340  TempSrc:   PainSc: 4    Pain Goal: Patients Stated Pain Goal: 3 (11/30/21 1230)                 Lidia Collum

## 2021-11-30 NOTE — Lactation Note (Signed)
This note was copied from a baby's chart. Lactation Consultation Note  Patient Name: Girl Aubryanna Nesheim [id number] Date: 11/30/2021 Reason for consult: Initial assessment;Early term 37-38.6wks;Infant < 6lbs (Mom's feeding choice is pump only and supplement with donor breast milk.) Age:43 hours Per mom, infant had two voids and two stools since birth. See maternal data below for mom's past breastfeeding experience. Mom will continue to pump every 3 hours for 15 minutes on initial setting. Mom knows EBM is safe at room temperature for 4 hours whereas, donor breast milk is safe for 1 hour. Mom shown how to use DEBP & how to disassemble, clean, & reassemble parts.  Mom knows to call [facility] if she has any breastfeeding questions or concerns. Day 1 : mom will continue to offer infant (10-15 mls) of EBM/donor milk per feeding. Mom will  continue to feed infant according to cues, 8 to 12 times within 24 hours. Mom made aware of O/P services, breastfeeding support groups, community resources, and our phone # for post-discharge questions.   Maternal Data Has patient been taught Hand Expression?: Yes Does the patient have breastfeeding experience prior to this delivery?: Yes How long did the patient breastfeed?: Mom "pumped only" for her twins that were in NICU, one son passed away at 2 days, the other son she pumped for 10 months and he passed away 4 years due surgery complications.  Feeding Mother's Current Feeding Choice: Breast Milk and Donor Milk Nipple Type: Slow - flow  LATCH Score                    Lactation Tools Discussed/Used Tools: Pump Breast pump type: Double-Electric Breast Pump Pump Education: Setup, frequency, and cleaning;Milk Storage Reason for Pumping: This is mom's feeding choice to pump only, she only latched infant in recovery. Pumping frequency: Mom will continue to pump every 3 hours for 15 minutes on inital setting.  Interventions Interventions: Hand  express;Expressed milk;DEBP;Education;Pace feeding;LC Services brochure  Discharge    Consult Status Consult Status: Follow-up Date: 12/01/21 Follow-up type: In-patient    Vicente Serene 11/30/2021, 3:59 PM

## 2021-11-30 NOTE — Transfer of Care (Signed)
Immediate Anesthesia Transfer of Care Note  Patient: Amber Whitehead  Procedure(s) Performed: CESAREAN SECTION  Patient Location: PACU  Anesthesia Type:Spinal  Level of Consciousness: awake, alert  and oriented  Airway & Oxygen Therapy: Patient Spontanous Breathing  Post-op Assessment: Report given to RN and Post -op Vital signs reviewed and stable  Post vital signs: Reviewed and stable  Last Vitals:  Vitals Value Taken Time  BP    Temp    Pulse    Resp    SpO2      Last Pain:  Vitals:   11/30/21 0644  TempSrc: Oral  PainSc: 0-No pain         Complications: No notable events documented.

## 2021-11-30 NOTE — Anesthesia Preprocedure Evaluation (Signed)
Anesthesia Evaluation  Patient identified by MRN, date of birth, ID band Patient awake    Reviewed: Allergy & Precautions, H&P , NPO status , Patient's Chart, lab work & pertinent test results  History of Anesthesia Complications Negative for: history of anesthetic complications  Airway Mallampati: II  TM Distance: >3 FB     Dental   Pulmonary neg pulmonary ROS,    Pulmonary exam normal        Cardiovascular hypertension, On Medications + DVT   Rhythm:regular Rate:Normal     Neuro/Psych Depression negative neurological ROS     GI/Hepatic negative GI ROS, Neg liver ROS,   Endo/Other  diabetes, GestationalMorbid obesity  Renal/GU negative Renal ROS  negative genitourinary   Musculoskeletal   Abdominal   Peds  Hematology negative hematology ROS (+)   Anesthesia Other Findings  On Lovenox 120 mg, last dose 6/6 @ 0600  Reproductive/Obstetrics (+) Pregnancy G4P0120 at [redacted]w[redacted]d                           Anesthesia Physical Anesthesia Plan  ASA: 3  Anesthesia Plan: Spinal   Post-op Pain Management:    Induction:   PONV Risk Score and Plan: Ondansetron and Treatment may vary due to age or medical condition  Airway Management Planned:   Additional Equipment:   Intra-op Plan:   Post-operative Plan:   Informed Consent: I have reviewed the patients History and Physical, chart, labs and discussed the procedure including the risks, benefits and alternatives for the proposed anesthesia with the patient or authorized representative who has indicated his/her understanding and acceptance.       Plan Discussed with: Anesthesiologist  Anesthesia Plan Comments:         Anesthesia Quick Evaluation

## 2021-11-30 NOTE — Progress Notes (Signed)
Subjective: Postpartum Day 0: Cesarean Delivery Patient reports tolerating PO.  Pain well controlled. Denies HA, CP or SOB. She is bonding well with baby . Working on pumping. No complaints   Objective: Vital signs in last 24 hours: Temp:  [98 F (36.7 C)-98.6 F (37 C)] 98.1 F (36.7 C) (06/07 1230) Pulse Rate:  [51-86] 55 (06/07 1230) Resp:  [13-18] 18 (06/07 1230) BP: (125-149)/(77-91) 125/77 (06/07 1230) SpO2:  [98 %-100 %] 98 % (06/07 1230) Weight:  [110 kg] 110 kg (06/07 0630)  Physical Exam:  General: alert, cooperative, and no distress Lochia: appropriate Uterine Fundus: firm Incision: no significant drainage DVT Evaluation: No evidence of DVT seen on physical exam.  Recent Labs    11/28/21 0921  HGB 11.0*  HCT 35.2*    Assessment/Plan: Status post Cesarean section. Doing well postoperatively.  Continue current care.  Isaiah Serge 11/30/2021, 1:42 PM

## 2021-11-30 NOTE — Anesthesia Procedure Notes (Signed)
Spinal  Patient location during procedure: OR Reason for block: surgical anesthesia Staffing Performed: anesthesiologist  Anesthesiologist: Lidia Collum, MD Preanesthetic Checklist Completed: patient identified, IV checked, risks and benefits discussed, surgical consent, monitors and equipment checked, pre-op evaluation and timeout performed Spinal Block Patient position: sitting Prep: DuraPrep and site prepped and draped Patient monitoring: continuous pulse ox, blood pressure and heart rate Approach: midline Location: L3-4 Injection technique: single-shot Needle Needle type: Pencan  Needle gauge: 24 G Needle length: 10 cm Assessment Events: CSF return Additional Notes Functioning IV was confirmed and monitors were applied. Sterile prep and drape, including hand hygiene and sterile gloves were used. The patient was positioned and the spine was prepped. The skin was anesthetized with lidocaine.  Free flow of clear CSF was obtained prior to injecting local anesthetic into the CSF. The needle was carefully withdrawn. The patient tolerated the procedure well.

## 2021-12-01 LAB — CBC
HCT: 31.5 % — ABNORMAL LOW (ref 36.0–46.0)
Hemoglobin: 9.6 g/dL — ABNORMAL LOW (ref 12.0–15.0)
MCH: 27.4 pg (ref 26.0–34.0)
MCHC: 30.5 g/dL (ref 30.0–36.0)
MCV: 90 fL (ref 80.0–100.0)
Platelets: 177 10*3/uL (ref 150–400)
RBC: 3.5 MIL/uL — ABNORMAL LOW (ref 3.87–5.11)
RDW: 17.7 % — ABNORMAL HIGH (ref 11.5–15.5)
WBC: 9.3 10*3/uL (ref 4.0–10.5)
nRBC: 0.4 % — ABNORMAL HIGH (ref 0.0–0.2)

## 2021-12-01 LAB — BIRTH TISSUE RECOVERY COLLECTION (PLACENTA DONATION)

## 2021-12-01 NOTE — Social Work (Signed)
CSW received consult for hx Depression and Edinburgh 10.  CSW met with MOB to offer support and complete assessment.    CSW met with MOB at bedside and introduced CSW role. CSW observed MOB in bed, FOB up in the room, maternal grandmother holding the infant and maternal grandfather present. CSW offered MOB privacy. MOB gave CSW permission to complete the assessment with family present. MOB identified her spouse, parents and siblings as supports. MOB presented calm and welcomed CSW visit. CSW inquired how MOB has felt since giving birth. MOB reported she has been feeling good and shared the L&D was a planned c-section which went well. CSW inquired how MOB has felt the past seven days. MOB expressed that she felt very anxious throughout the pregnancy due to several miscarriages in the past. MOB acknowledged that she has a history of depression and anxiety. MOB reported she was diagnosed with depression in 2009. MOB reported she has took an anti-depressant in the past and currently treats her symptoms with Zoloft which she feels is helpful. MOB reported she will continue taking the Zoloft postpartum. MOB reported she is not interested in counseling or therapy at this time. MOB was knowledgeable of PPD and the baby blues period. CSW provided review of the baby blues period vs. perinatal mood disorders. CSW recommended MOB complete a self-evaluation during the postpartum time period using the New Mom Checklist from Postpartum Progress and encouraged MOB to contact a medical professional if symptoms are noted at any time. CSW assessed MOB for safety. MOB denied thoughts of harm to self and others.   CSW provided review of Sudden Infant Death Syndrome (SIDS) precautions. MOB reported she has all items for the infant including a bassinet where the infant will sleep. MOB has chosen [employer] where the infant will sleep. CSW assessed MOB for additional needs. MOB reported no further need.   CSW identifies no  further need for intervention and no barriers to discharge at this time.  Kathrin Greathouse, MSW, LCSW [facility] [place] Worker  [phone] 12/01/2021  11:33 AM

## 2021-12-01 NOTE — Progress Notes (Signed)
Patient is doing well.  She is tolerating PO, ambulating, voiding.  Pain is controlled.  Lochia is appropriate  Vitals:   11/30/21 1511 11/30/21 2039 12/01/21 0023 12/01/21 0400  BP: 127/82 125/74 102/61 128/83  Pulse: 73 62 (!) 56 (!) 55  Resp: '18 18 18 18  '$ Temp: 98.6 F (37 C) 98.3 F (36.8 C) 98.3 F (36.8 C) 98.2 F (36.8 C)  TempSrc: Oral Oral Oral Oral  SpO2: 96% 96% 97% 97%  Weight:      Height:        NAD Abdomen:  soft, appropriate tenderness, incisions intact and without erythema or drainage ext:    Symmetric, 2= edema bilaterally  Lab Results  Component Value Date   WBC 9.3 12/01/2021   HGB 9.6 (L) 12/01/2021   HCT 31.5 (L) 12/01/2021   MCV 90.0 12/01/2021   PLT 177 12/01/2021    --/--/B POS (06/05 5465)  A/P    41 y.o. [id number] POD#1 s/p primary cesarean section for h/o myomectomy HTN: on procardia XL GDMA1 H/o DVT, followed by hematology.  On bid lovenox at their recommended dose to start today Routine post op and postpartum care.

## 2021-12-02 MED ORDER — NIFEDIPINE ER OSMOTIC RELEASE 30 MG PO TB24
60.0000 mg | ORAL_TABLET | Freq: Every day | ORAL | Status: DC
  Administered 2021-12-02: 30 mg via ORAL
  Administered 2021-12-03: 60 mg via ORAL
  Filled 2021-12-02 (×2): qty 2

## 2021-12-02 NOTE — Progress Notes (Signed)
Subjective: Postpartum Day 2: Cesarean Delivery Patient reports pain greater today but controlled. Tolerating PO, ambulating  Objective: Vital signs in last 24 hours: Temp:  [97.6 F (36.4 C)-98.3 F (36.8 C)] 98.1 F (36.7 C) (06/09 0500) Pulse Rate:  [70-72] 70 (06/09 0500) Resp:  [18] 18 (06/09 0500) BP: (123-144)/(77-87) 141/87 (06/09 0500) SpO2:  [99 %] 99 % (06/09 0500)  Physical Exam:  General: alert, cooperative, and appears stated age Lochia: appropriate Uterine Fundus: firm Incision: healing well DVT Evaluation: No evidence of DVT seen on physical exam.  Recent Labs    12/01/21 0503  HGB 9.6*  HCT 31.5*    Assessment/Plan: Status post Cesarean section. Doing well postoperatively.  Continue current care. BPs mild range on Procardia '30mg'$  XL, will increase to '60mg'$  Pt desires to postpone discharge until tomorrow  Vanessa Kick 12/02/2021, 10:59 AM

## 2021-12-02 NOTE — Lactation Note (Signed)
This note was copied from a baby's chart. Lactation Consultation Note  Patient Name: Amber Whitehead [id number] Date: 12/02/2021 Reason for consult: Follow-up assessment;Early term 37-38.6wks;Infant < 6lbs;Exclusive pumping and bottle feeding Age:43 hours  Mother is pumping and bottle feeding only. Her choice is mother's milk and donor milk. She at one time pumped 10 ml.  This morning mother pumped 1 ml.  Mother is pumping q 3 hours.  Baby is taking volume of approx 20 ml with slow flow nipple.  Parents state they have Dr. Roosvelt Harps slow flow nipple for home use.   Parents are aware to increase volume per day of life.  Feeding Mother's Current Feeding Choice: Breast Milk and Donor Milk  Lactation Tools Discussed/Used Tools: Pump Breast pump type: Double-Electric Breast Pump Pump Education: Milk Storage Reason for Pumping: stimulation and supplementation Pumping frequency: q 3 hours Pumped volume: 1 mL  Interventions  Education  Discharge Pump: Personal;DEBP (Medela (2 pumps))  Consult Status Consult Status: Follow-up Date: 12/03/21 Follow-up type: In-patient    Amber Whitehead [facility] 12/02/2021, 9:13 AM

## 2021-12-03 ENCOUNTER — Encounter (HOSPITAL_COMMUNITY): Payer: Self-pay | Admitting: *Deleted

## 2021-12-03 MED ORDER — OXYCODONE HCL 5 MG PO TABS: 5.0000 mg | ORAL_TABLET | ORAL | 0 refills | Status: DC | PRN

## 2021-12-03 MED ORDER — NIFEDIPINE ER 60 MG PO TB24: 60.0000 mg | ORAL_TABLET | Freq: Every day | ORAL | 1 refills | Status: DC

## 2021-12-03 MED ORDER — IBUPROFEN 600 MG PO TABS: 600.0000 mg | ORAL_TABLET | Freq: Four times a day (QID) | ORAL | 0 refills | Status: DC

## 2021-12-03 MED ORDER — MEASLES, MUMPS & RUBELLA VAC IJ SOLR
0.5000 mL | Freq: Once | INTRAMUSCULAR | Status: AC
  Administered 2021-12-03: 0.5 mL via SUBCUTANEOUS
  Filled 2021-12-03: qty 0.5

## 2021-12-03 NOTE — Plan of Care (Signed)
  Problem: Clinical Measurements: Goal: Will remain free from infection Outcome: Completed/Met Goal: Diagnostic test results will improve Outcome: Completed/Met   Problem: Pain Managment: Goal: General experience of comfort will improve Outcome: Completed/Met   Problem: Safety: Goal: Ability to remain free from injury will improve Outcome: Completed/Met   Problem: Education: Goal: Knowledge of condition will improve Outcome: Completed/Met   Problem: Activity: Goal: Will verbalize the importance of balancing activity with adequate rest periods Outcome: Completed/Met Goal: Ability to tolerate increased activity will improve Outcome: Completed/Met   Problem: Life Cycle: Goal: Chance of risk for complications during the postpartum period will decrease Outcome: Completed/Met   Problem: Role Relationship: Goal: Ability to demonstrate positive interaction with newborn will improve Outcome: Completed/Met

## 2021-12-03 NOTE — Lactation Note (Signed)
This note was copied from a baby's chart. Lactation Consultation Note  Patient Name: Amber Whitehead [id number] Date: 12/03/2021 Reason for consult: Follow-up assessment;Exclusive pumping and bottle feeding;Early term 37-38.6wks;Infant < 6lbs;Other (Comment) (gained 15 gm. Dyad for D/C today. LC reviewed Supply and demand / importance of consistent pumping around the clock (8-10 times both breast for 15 -20 mins) milk is coming in.) Age:43 hours Mom aware of the [facility] resources after D/C.  LC reviewed and updated the doc flow sheets  Maternal Data    Feeding Mother's Current Feeding Choice: Breast Milk and Donor Milk Nipple Type: Extra Slow Flow  LATCH Score                    Lactation Tools Discussed/Used Tools: Pump;Flanges Flange Size: 24 Breast pump type: Double-Electric Breast Pump Pump Education: Milk Storage  Interventions Interventions: Breast feeding basics reviewed;Education;LC Services brochure  Discharge Discharge Education: Engorgement and breast care;Warning signs for feeding baby Pump: DEBP;Personal  Consult Status Consult Status: Complete Date: 12/03/21    Jerlyn Ly [place]oll 12/03/2021, 8:40 AM

## 2021-12-03 NOTE — Discharge Summary (Signed)
Postpartum Discharge Summary     Patient Name: Amber Whitehead DOB: [birth year] MRN: [id number]  Date of admission: 11/30/2021 Delivery date:11/30/2021  Delivering provider: Paula Compton  Date of discharge: 12/03/2021  Admitting diagnosis: S/P primary low transverse C-section [Z98.891] Intrauterine pregnancy: [redacted]w[redacted]d    Secondary diagnosis:  Principal Problem:   S/P primary low transverse C-section     Discharge diagnosis: Term Pregnancy Delivered, CHTN, GDM A1, and previous myomectomy, incompetent cervix, depression, h/o DVT                                                Hospital course: Sceduled C/S   41y.o. yo G4P0120 at 335w5das admitted to the hospital 11/30/2021 for scheduled cesarean section with the following indication:Prior Uterine Surgery.Delivery details are as follows:  Membrane Rupture Time/Date: 9:07 AM ,11/30/2021   Delivery Method:C-Section, Low Transverse  Details of operation can be found in separate operative note.  Patient had an uncomplicated postpartum course.  Procardia increased from 30 mg to 60 mg to control BP, resumed Lovenox.  She is ambulating, tolerating a regular diet, passing flatus, and urinating well. Patient is discharged home in stable condition on  12/03/21        Newborn Data: Birth date:11/30/2021  Birth time:9:08 AM  Gender:Female  Living status:Living  Apgars:8 ,9  Weight:2410 g     Physical exam  Vitals:   12/02/21 1457 12/02/21 2048 12/02/21 2152 12/03/21 0532  BP: 126/86 139/84 129/75 140/85  Pulse: 83 79 70 68  Resp: '20 20  18  '$ Temp: 98.1 F (36.7 C) 98.6 F (37 C)  97.8 F (36.6 C)  TempSrc:  Oral  Oral  SpO2:      Weight:      Height:       General: alert Lochia: appropriate Uterine Fundus: firm Incision: Healing well with no significant drainage  Labs: Lab Results  Component Value Date   WBC 9.3 12/01/2021   HGB 9.6 (L) 12/01/2021   HCT 31.5 (L) 12/01/2021   MCV 90.0 12/01/2021   PLT 177 12/01/2021       Latest Ref Rng & Units 11/28/2021    9:21 AM  CMP  Glucose 70 - 99 mg/dL 120   BUN 6 - 20 mg/dL 14   Creatinine 0.44 - 1.00 mg/dL 0.91   Sodium 135 - 145 mmol/L 138   Potassium 3.5 - 5.1 mmol/L 4.3   Chloride 98 - 111 mmol/L 111   CO2 22 - 32 mmol/L 20   Calcium 8.9 - 10.3 mg/dL 8.7    Edinburgh Score:    11/30/2021   12:30 PM  Edinburgh Postnatal Depression Scale Screening Tool  I have been able to laugh and see the funny side of things. 0  I have looked forward with enjoyment to things. 0  I have blamed myself unnecessarily when things went wrong. 1  I have been anxious or worried for no good reason. 2  I have felt scared or panicky for no good reason. 2  Things have been getting on top of me. 1  I have been so unhappy that I have had difficulty sleeping. 2  I have felt sad or miserable. 1  I have been so unhappy that I have been crying. 1  The thought of harming myself has occurred to me. 0  Edinburgh Postnatal Depression Scale Total 10      After visit meds:  Allergies as of 12/03/2021   No Known Allergies      Medication List     STOP taking these medications    progesterone 200 MG capsule Commonly known as: PROMETRIUM       TAKE these medications    acetaminophen 325 MG tablet Commonly known as: TYLENOL Take 650 mg by mouth every 6 (six) hours as needed for moderate pain or headache.   docusate sodium 100 MG capsule Commonly known as: COLACE Take 100 mg by mouth at bedtime.   enoxaparin 120 MG/0.8ML injection Commonly known as: Lovenox Inject 0.8 mLs (120 mg total) into the skin every 12 (twelve) hours.   ferrous sulfate 325 (65 FE) MG EC tablet TAKE 1 TABLET BY MOUTH 2 TIMES DAILY WITH A MEAL.   fluticasone 50 MCG/ACT nasal spray Commonly known as: FLONASE Place 2 sprays into both nostrils daily as needed for allergies.   ibuprofen 600 MG tablet Commonly known as: ADVIL Take 1 tablet (600 mg total) by mouth every 6 (six) hours.   NIFEdipine  60 MG 24 hr tablet Commonly known as: ADALAT CC Take 1 tablet (60 mg total) by mouth daily. What changed:  medication strength how much to take   oxyCODONE 5 MG immediate release tablet Commonly known as: Oxy IR/ROXICODONE Take 1 tablet (5 mg total) by mouth every 4 (four) hours as needed for severe pain.   PRENATAL VITAMINS PO Take 1 tablet by mouth daily.   sertraline 50 MG tablet Commonly known as: ZOLOFT Take 50 mg by mouth at bedtime.         Discharge home in stable condition Infant Feeding: Breast Infant Disposition:home with mother Discharge instruction: per After Visit Summary and Postpartum booklet. Activity: Advance as tolerated. Pelvic rest for 6 weeks.  Diet: routine diet Postpartum Appointment:2 weeks Future Appointments: Future Appointments  Date Time Provider [place]  12/06/2021  1:15 PM [facility] None  12/08/2021  1:30 PM Earlie Server, MD [facility] None  05/02/2022  8:40 AM Bacigalupo, Dionne Bucy, MD [facility]   Follow up Visit:  Follow-up Information     Paula Compton, MD. Schedule an appointment as soon as possible for a visit in 2 week(s).   Specialty: Obstetrics and Gynecology Contact information: [address] [place] [address] [phone]                     12/03/2021 Clarene Duke, MD

## 2021-12-03 NOTE — Discharge Instructions (Signed)
As per discharge pamphlet °

## 2021-12-03 NOTE — Progress Notes (Signed)
POD #3 Doing well, sore when gets up in am Afeb, VSS, BP 120-140/70-80 Abd- soft, fundus firm, incision intact Will d/c home as long as baby ok to go

## 2021-12-05 ENCOUNTER — Other Ambulatory Visit: Payer: Self-pay

## 2021-12-05 DIAGNOSIS — Z86718 Personal history of other venous thrombosis and embolism: Secondary | ICD-10-CM

## 2021-12-06 ENCOUNTER — Other Ambulatory Visit: Payer: Self-pay

## 2021-12-06 ENCOUNTER — Inpatient Hospital Stay: Payer: BC Managed Care – PPO | Attending: Oncology

## 2021-12-06 DIAGNOSIS — D6852 Prothrombin gene mutation: Secondary | ICD-10-CM | POA: Diagnosis present

## 2021-12-06 DIAGNOSIS — Z801 Family history of malignant neoplasm of trachea, bronchus and lung: Secondary | ICD-10-CM | POA: Insufficient documentation

## 2021-12-06 DIAGNOSIS — O9913 Other diseases of the blood and blood-forming organs and certain disorders involving the immune mechanism complicating the puerperium: Secondary | ICD-10-CM | POA: Diagnosis present

## 2021-12-06 DIAGNOSIS — D508 Other iron deficiency anemias: Secondary | ICD-10-CM | POA: Insufficient documentation

## 2021-12-06 DIAGNOSIS — O870 Superficial thrombophlebitis in the puerperium: Secondary | ICD-10-CM | POA: Diagnosis not present

## 2021-12-06 DIAGNOSIS — Z7901 Long term (current) use of anticoagulants: Secondary | ICD-10-CM | POA: Diagnosis not present

## 2021-12-06 DIAGNOSIS — Z86718 Personal history of other venous thrombosis and embolism: Secondary | ICD-10-CM | POA: Insufficient documentation

## 2021-12-06 LAB — IRON AND TIBC
Iron: 108 ug/dL (ref 28–170)
Saturation Ratios: 16 % (ref 10.4–31.8)
TIBC: 683 ug/dL — ABNORMAL HIGH (ref 250–450)
UIBC: 575 ug/dL

## 2021-12-06 LAB — CBC WITH DIFFERENTIAL/PLATELET
Abs Immature Granulocytes: 0.03 10*3/uL (ref 0.00–0.07)
Basophils Absolute: 0 10*3/uL (ref 0.0–0.1)
Basophils Relative: 1 %
Eosinophils Absolute: 0.1 10*3/uL (ref 0.0–0.5)
Eosinophils Relative: 2 %
HCT: 36.3 % (ref 36.0–46.0)
Hemoglobin: 11.1 g/dL — ABNORMAL LOW (ref 12.0–15.0)
Immature Granulocytes: 0 %
Lymphocytes Relative: 28 %
Lymphs Abs: 2 10*3/uL (ref 0.7–4.0)
MCH: 27.8 pg (ref 26.0–34.0)
MCHC: 30.6 g/dL (ref 30.0–36.0)
MCV: 90.8 fL (ref 80.0–100.0)
Monocytes Absolute: 0.4 10*3/uL (ref 0.1–1.0)
Monocytes Relative: 5 %
Neutro Abs: 4.5 10*3/uL (ref 1.7–7.7)
Neutrophils Relative %: 64 %
Platelets: 264 10*3/uL (ref 150–400)
RBC: 4 MIL/uL (ref 3.87–5.11)
RDW: 17.9 % — ABNORMAL HIGH (ref 11.5–15.5)
WBC: 7 10*3/uL (ref 4.0–10.5)
nRBC: 0 % (ref 0.0–0.2)

## 2021-12-06 LAB — FERRITIN: Ferritin: 20 ng/mL (ref 11–307)

## 2021-12-08 ENCOUNTER — Inpatient Hospital Stay (HOSPITAL_BASED_OUTPATIENT_CLINIC_OR_DEPARTMENT_OTHER): Payer: BC Managed Care – PPO | Admitting: Oncology

## 2021-12-08 ENCOUNTER — Encounter: Payer: Self-pay | Admitting: Oncology

## 2021-12-08 VITALS — BP 137/92 | HR 89 | Temp 97.8°F | Wt 223.0 lb

## 2021-12-08 DIAGNOSIS — O9913 Other diseases of the blood and blood-forming organs and certain disorders involving the immune mechanism complicating the puerperium: Secondary | ICD-10-CM | POA: Diagnosis not present

## 2021-12-08 DIAGNOSIS — I8002 Phlebitis and thrombophlebitis of superficial vessels of left lower extremity: Secondary | ICD-10-CM

## 2021-12-08 DIAGNOSIS — D6852 Prothrombin gene mutation: Secondary | ICD-10-CM | POA: Diagnosis not present

## 2021-12-08 DIAGNOSIS — D508 Other iron deficiency anemias: Secondary | ICD-10-CM | POA: Diagnosis not present

## 2021-12-08 DIAGNOSIS — Z86718 Personal history of other venous thrombosis and embolism: Secondary | ICD-10-CM

## 2021-12-08 NOTE — Progress Notes (Signed)
Hematology/Oncology Progress note [phone] [phone]         Patient Care Team: Virginia Crews, MD as PCP - General (Family Medicine)  REFERRING PROVIDER: Virginia Crews, MD  CHIEF COMPLAINTS/REASON FOR VISIT:  Follow up for history of DVT, heterozygous prothrombin gene mutation, anticoagulation during pregnancy.  HISTORY OF PRESENTING ILLNESS:   Amber Whitehead is a  41 y.o.  female with PMH listed below was seen in consultation at the request of  Bacigalupo, Dionne Bucy, MD  for evaluation of history of DVT.  Patient developed pain and swelling of right lower extremity.  Patient had recent myomectomy surgery done on 07/02/2018 with immobility at that time. 07/17/2018, unilateral right lower extremity ultrasound showed DVT of the right lower extremity, nonocclusive thrombus in the right common femoral and femoral veins.  Complex solid and cystic mass in the right groin measuring up to 7.4 cm as described.  Nonspecific.   DVT was felt to be provoked by surgery.  Patient completed 3 months of Xarelto.  For right groin mass that was incidentally found on ultrasound 11/14/2018, she had another repeat ultrasound which showed right groin mass increasing size slightly 12/10/2018, CT abdomen pelvis with contrast showed soft tissue attenuation mass measuring approximately 8.1 x 6.7 x 3.6 cm contained within a right femoral hernia.  Nonspecific soft tissue attenuation nodule of left adrenal gland measuring 1.9 cm. 01/01/2019, core biopsy of right groin showed mesothelial lined fibroadipose tissue with mild chronic inflammation and reactive changes.  No evidence of malignancy.  11/3 2021, CT adrenal abdominal without contrast showed stable size of hypodense left adrenal gland nodule. 04/14/2021 CT abdomen pelvis with contrast showed increased size of fluid density collection.  Contained within the otherwise fat-containing right femoral hernia also now with increased  compression of right femoral vein by the hernia and increased inflammatory stranding within the herniated fat.  Leiomyomatous uterus.  Hypodense 1.8 cm left adrenal nodule is stable.  04/25/2021, patient developed acute pain of left lower extremity.   10/31 2022, ultrasound lower extremity left unilateral was positive for superficial venous thrombosis in the left great saphenous vein.  Involving proximal left calf and the distal left thigh.  Negative for DVT in left lower extremity.  Cystic fluid collection in the right groin.  Patient was started on Xarelto 10 mg for treatment of symptomatic SVT with the plan of 45 days duration  04/26/2021, patient was seen by Dr. Christian Mate for right incarcerated femoral hernia.  There is plan for robotic repair.  Surgery was canceled as pregnancy testing was positive.  INTERVAL HISTORY Amber Whitehead is a 41 y.o. female who has above history reviewed by me today presents for follow up visit for anticoagulation, history of DVT, heterozygous prothrombin gene mutation, in pregnancy.  Patient had C section last week. Accompanied by her husband.  She current breast feeds.  Denies SOB, chest pain, leg swelling.   Review of Systems  Constitutional:  Negative for appetite change, chills, fatigue and fever.  HENT:   Negative for hearing loss and voice change.   Eyes:  Negative for eye problems.  Respiratory:  Negative for chest tightness and cough.   Cardiovascular:  Negative for chest pain.  Gastrointestinal:  Negative for abdominal distention, abdominal pain and blood in stool.  Endocrine: Negative for hot flashes.  Genitourinary:  Negative for difficulty urinating and frequency.   Musculoskeletal:  Negative for arthralgias.  Skin:  Negative for itching and rash.  Neurological:  Negative for  extremity weakness.  Hematological:  Negative for adenopathy. Bruises/bleeds easily.  Psychiatric/Behavioral:  Negative for confusion.     MEDICAL HISTORY:  Past  Medical History:  Diagnosis Date   Acute superficial venous thrombosis of left lower extremity 04/25/2021   left great saphenous vein   Depression    Gestational diabetes    History of premature delivery    of twins   Irritable bowel syndrome (IBS)    Pneumonia 10/2014   Pregnancy induced hypertension    Right leg DVT ([place]) 2020   post surgery; took Xarelto for about 3 months after   Seasonal allergies    Uterine fibroid     SURGICAL HISTORY: Past Surgical History:  Procedure Laterality Date   CERVICAL CERCLAGE N/A 06/30/2021   Procedure: CERCLAGE CERVICAL;  Surgeon: Paula Compton, MD;  Location: [facility];  Service: Gynecology;  Laterality: N/A;   CESAREAN SECTION N/A 11/30/2021   Procedure: CESAREAN SECTION;  Surgeon: Paula Compton, MD;  Location: [facility];  Service: Obstetrics;  Laterality: N/A;   ROBOT ASSISTED MYOMECTOMY N/A 07/12/2018   Procedure: XI ROBOTIC ASSISTED LAPAROSCOPIC MYOMECTOMY;  Surgeon: Governor Specking, MD;  Location: [facility];  Service: Gynecology;  Laterality: N/A;   WISDOM TOOTH EXTRACTION      SOCIAL HISTORY: Social History   Socioeconomic History   Marital status: Married    Spouse name: Jonathon   Number of children: Not on file   Years of education: Not on file   Highest education level: Not on file  Occupational History   Not on file  Tobacco Use   Smoking status: Never   Smokeless tobacco: Never  Vaping Use   Vaping Use: Never used  Substance and Sexual Activity   Alcohol use: No   Drug use: No   Sexual activity: Not Currently    Birth control/protection: Condom  Other Topics Concern   Not on file  Social History Narrative   Not on file   Social Determinants of Health   Financial Resource Strain: Not on file  Food Insecurity: Not on file  Transportation Needs: Not on file  Physical Activity: Not on file  Stress: Not on file  Social Connections: Not on file  Intimate Partner Violence: Not on file     FAMILY HISTORY: Family History  Problem Relation Age of Onset   Healthy Mother    Healthy Father    Healthy Sister    Lung cancer Maternal Grandmother    Healthy Sister    Breast cancer Neg Hx    Cancer - Colon Neg Hx     ALLERGIES:  has No Known Allergies.  MEDICATIONS:  Current Outpatient Medications  Medication Sig Dispense Refill   acetaminophen (TYLENOL) 325 MG tablet Take 650 mg by mouth every 6 (six) hours as needed for moderate pain or headache.     docusate sodium (COLACE) 100 MG capsule Take 100 mg by mouth at bedtime.     enoxaparin (LOVENOX) 120 MG/0.8ML injection Inject 0.8 mLs (120 mg total) into the skin every 12 (twelve) hours. 48 mL 0   ferrous sulfate 325 (65 FE) MG EC tablet TAKE 1 TABLET BY MOUTH 2 TIMES DAILY WITH A MEAL. 60 tablet 3   fluticasone (FLONASE) 50 MCG/ACT nasal spray Place 2 sprays into both nostrils daily as needed for allergies.     ibuprofen (ADVIL) 600 MG tablet Take 1 tablet (600 mg total) by mouth every 6 (six) hours. 30 tablet 0   NIFEdipine (ADALAT  CC) 60 MG 24 hr tablet Take 1 tablet (60 mg total) by mouth daily. 30 tablet 1   oxyCODONE (OXY IR/ROXICODONE) 5 MG immediate release tablet Take 1 tablet (5 mg total) by mouth every 4 (four) hours as needed for severe pain. 15 tablet 0   Prenatal Vit-Fe Fumarate-FA (PRENATAL VITAMINS PO) Take 1 tablet by mouth daily.     sertraline (ZOLOFT) 100 MG tablet Zoloft 100 mg tablet  Take 1 tablet every day by oral route.     sertraline (ZOLOFT) 50 MG tablet Take 100 mg by mouth at bedtime. (Patient not taking: Reported on 12/08/2021)     No current facility-administered medications for this visit.     PHYSICAL EXAMINATION: ECOG PERFORMANCE STATUS: 1 - Symptomatic but completely ambulatory There were no vitals filed for this visit.  There were no vitals filed for this visit.   Physical Exam Constitutional:      General: She is not in acute distress.    Appearance: She is obese.  HENT:      Head: Normocephalic and atraumatic.  Eyes:     General: No scleral icterus. Cardiovascular:     Rate and Rhythm: Normal rate and regular rhythm.     Heart sounds: Normal heart sounds.  Pulmonary:     Effort: Pulmonary effort is normal. No respiratory distress.     Breath sounds: No wheezing.  Abdominal:     Palpations: Abdomen is soft.  Musculoskeletal:        General: No deformity. Normal range of motion.     Cervical back: Normal range of motion and neck supple.  Skin:    General: Skin is warm and dry.     Findings: Bruising present. No erythema or rash.  Neurological:     Mental Status: She is alert and oriented to person, place, and time. Mental status is at baseline.     Cranial Nerves: No cranial nerve deficit.     Coordination: Coordination normal.  Psychiatric:        Mood and Affect: Mood normal.    LABORATORY DATA:  I have reviewed the data as listed Lab Results  Component Value Date   WBC 7.0 12/06/2021   HGB 11.1 (L) 12/06/2021   HCT 36.3 12/06/2021   MCV 90.8 12/06/2021   PLT 264 12/06/2021   Recent Labs    05/03/21 0833 07/18/21 1414 10/17/21 1308 11/28/21 0921  NA 135 138 132* 138  K 4.2 3.4* 4.2 4.3  CL 101 105 105 111  CO2 '20 24 22 '$ 20*  GLUCOSE 89 91 91 120*  BUN 9 11 23* 14  CREATININE 0.66 0.61 0.75 0.91  CALCIUM 8.9 9.1 9.6 8.7*  GFRNONAA  --  >60 >60 >60  PROT 6.1 6.7 6.5  --   ALBUMIN 3.7* 3.2* 2.9*  --   AST 12 13* 16  --   ALT '12 14 19  '$ --   ALKPHOS 62 131* 352*  --   BILITOT 0.3 0.4 0.4  --     Iron/TIBC/Ferritin/ %Sat    Component Value Date/Time   IRON 108 12/06/2021 1309   TIBC 683 (H) 12/06/2021 1309   FERRITIN 20 12/06/2021 1309   IRONPCTSAT 16 12/06/2021 1309      RADIOGRAPHIC STUDIES: I have personally reviewed the radiological images as listed and agreed with the findings in the report. No results found.    ASSESSMENT & PLAN:  1. History of deep vein thrombosis (DVT) of lower extremity   2.  Thrombophlebitis  of superficial veins of left lower extremity   3. Prothrombin gene mutation ([place])   4. Other iron deficiency anemia    #provoked symptomatic superficial thrombosis of left lower extremity, history of provoked DVT. Heterozygous prothrombin gene mutation,other risk factors including history of DVT, age >35, obesity. Continue Lovenox '1mg'$ /kg BID for 6 weeks postpartum. Then stop.  Recommend prophylactic therapeutic anticoagulation for pregnancy, post surgery, or any immobilization circumstances   # iron deficiency anemia. Improved. Recommend continue ferrous sulfate 325 mg daily while breast feeding.   She is discharged from hematology clinic. Recommend her to follow up with PCP .   All questions were answered. The patient knows to call the clinic with any problems questions or concerns.  cc Brita Romp Dionne Bucy, MD     Earlie Server, MD, PhD  12/08/2021

## 2021-12-09 ENCOUNTER — Telehealth (HOSPITAL_COMMUNITY): Payer: Self-pay | Admitting: *Deleted

## 2021-12-09 NOTE — Telephone Encounter (Signed)
Left phone voicemail message.  Odis Hollingshead, RN 12-09-2021 at 12:25pm

## 2022-01-04 ENCOUNTER — Encounter: Payer: Self-pay | Admitting: Physician Assistant

## 2022-01-04 ENCOUNTER — Ambulatory Visit: Payer: BC Managed Care – PPO | Admitting: Physician Assistant

## 2022-01-04 VITALS — BP 100/66 | HR 83 | Temp 98.2°F | Resp 16 | Ht 60.0 in | Wt 213.2 lb

## 2022-01-04 DIAGNOSIS — B070 Plantar wart: Secondary | ICD-10-CM

## 2022-01-04 DIAGNOSIS — L84 Corns and callosities: Secondary | ICD-10-CM

## 2022-01-04 NOTE — Progress Notes (Signed)
I,Sulibeya S Dimas,acting as a Education administrator for [employer], PA-C.,have documented all relevant documentation on the behalf of Mardene Speak, PA-C,as directed by  [employer], PA-C while in the presence of [employer], PA-C.   Established patient visit   Patient: Amber Whitehead   DOB: March 05, 1981   41 y.o. Female  MRN: [id number] Visit Date: 01/04/2022  Today's healthcare provider: Mardene Speak, PA-C   Chief Complaint  Patient presents with   Foot Pain   Subjective    HPI  Patient here today C/O left foot pain x 2 days. She denies any injuries, swelling or rash. Patient reports pain is worse with weight bearing. Patient denies using any OTC medications.  Denies hx of DM, neurologic conditions. Has a hx of thrombophlebitis. DVT of the lower extremity  Medications: Outpatient Medications Prior to Visit  Medication Sig   buPROPion (WELLBUTRIN XL) 150 MG 24 hr tablet Take 1 tablet by mouth daily. By Paula Compton   enoxaparin (LOVENOX) 120 MG/0.8ML injection Inject 0.8 mLs (120 mg total) into the skin every 12 (twelve) hours.   fluticasone (FLONASE) 50 MCG/ACT nasal spray Place 2 sprays into both nostrils daily as needed for allergies.   NIFEdipine (ADALAT CC) 60 MG 24 hr tablet Take 1 tablet (60 mg total) by mouth daily.   Prenatal Vit-Fe Fumarate-FA (PRENATAL VITAMINS PO) Take 1 tablet by mouth daily.   sertraline (ZOLOFT) 100 MG tablet Zoloft 100 mg tablet  Take 1 tablet every day by oral route.   sertraline (ZOLOFT) 50 MG tablet Take 100 mg by mouth at bedtime.   acetaminophen (TYLENOL) 325 MG tablet Take 650 mg by mouth every 6 (six) hours as needed for moderate pain or headache.   docusate sodium (COLACE) 100 MG capsule Take 100 mg by mouth at bedtime.   ferrous sulfate 325 (65 FE) MG EC tablet TAKE 1 TABLET BY MOUTH 2 TIMES DAILY WITH A MEAL.   ibuprofen (ADVIL) 600 MG tablet Take 1 tablet (600 mg total) by mouth every 6 (six) hours.   oxyCODONE (OXY  IR/ROXICODONE) 5 MG immediate release tablet Take 1 tablet (5 mg total) by mouth every 4 (four) hours as needed for severe pain.   No facility-administered medications prior to visit.    Review of Systems  Constitutional:  Negative for appetite change and fatigue.  Respiratory:  Negative for chest tightness and shortness of breath.   Cardiovascular:  Negative for chest pain and palpitations.  Gastrointestinal:  Negative for abdominal pain, nausea and vomiting.  Musculoskeletal:        Left foot pain        Objective    BP 100/66 (BP Location: Left Arm, Patient Position: Sitting, Cuff Size: Large)   Pulse 83   Temp 98.2 F (36.8 C) (Oral)   Resp 16   Ht 5' (1.524 m)   Wt 213 lb 3.2 oz (96.7 kg)   BMI 41.64 kg/m  BP Readings from Last 3 Encounters:  01/04/22 100/66  12/08/21 (!) 137/92  12/03/21 140/85   Wt Readings from Last 3 Encounters:  01/04/22 213 lb 3.2 oz (96.7 kg)  12/08/21 223 lb (101.2 kg)  11/30/21 242 lb 8 oz (110 kg)      Physical Exam Vitals reviewed.  Constitutional:      Appearance: Normal appearance. She is obese.  HENT:     Head: Normocephalic and atraumatic.  Pulmonary:     Effort: Pulmonary effort is normal.  Musculoskeletal:  General: Normal range of motion.  Skin:    General: Skin is dry.     Findings: Lesion present. No bruising or erythema.     Comments:  A circumscribed hyperkeratotic lesion with a central conical core of keratin/tender/plantar aspect of the left midfoot   Neurological:     General: No focal deficit present.     Mental Status: She is alert and oriented to person, place, and time.  Psychiatric:        Behavior: Behavior normal.        Thought Content: Thought content normal.        Judgment: Judgment normal.    No results found for any visits on 01/04/22.  Assessment & Plan     1. Callus of foot/tyloma 2. Corn of foot/heloma 3. Plantar wart It is not looks like a loss of skin lines within the  wart  Warm water/Epsom salt soaks Use sandpaper discs or pumice stones over hard, thickened areas of skin; can be done safely at home. Use bandages, soft foam padding, or silicone sleeve over the affected area to decrease friction on the skin and promote healing  Use socks regularly Low-heeled shoes; soft upper with deep and wide toe box Avoidance of activities that contribute to painful lesions  Keratolytic agents over-the-counter, such as urea, ammonium lactate, or salicylic acid plaster/ointment can be applied safely  If symptoms persist, referral to podiatry will be placed.  Preventive measures: Avoid sharing shoes, socks, towels, tools (nail file, razors) Maintain personal hygiene, keep feet clean and dry Wear breathable footwear Wear footwear in communal shower room Avoid scratching or manipulating warts  FU PRN The patient was advised to call back or seek an in-person evaluation if the symptoms worsen or if the condition fails to improve as anticipated.  I discussed the assessment and treatment plan with the patient. The patient was provided an opportunity to ask questions and all were answered. The patient agreed with the plan and demonstrated an understanding of the instructions.  The entirety of the information documented in the History of Present Illness, Review of Systems and Physical Exam were personally obtained by me. Portions of this information were initially documented by the CMA and reviewed by me for thoroughness and accuracy.  Portions of this note were created using dictation software and may contain typographical errors.     Mardene Speak, PA-C  [facility] [phone] (phone) [phone] (fax)  [place]

## 2022-01-11 ENCOUNTER — Ambulatory Visit: Payer: BC Managed Care – PPO | Admitting: Oncology

## 2022-01-11 ENCOUNTER — Other Ambulatory Visit: Payer: BC Managed Care – PPO

## 2022-03-15 ENCOUNTER — Other Ambulatory Visit: Payer: Self-pay | Admitting: Dermatology

## 2022-03-15 ENCOUNTER — Encounter: Payer: Self-pay | Admitting: Dermatology

## 2022-03-15 ENCOUNTER — Ambulatory Visit: Payer: BC Managed Care – PPO | Admitting: Dermatology

## 2022-03-15 DIAGNOSIS — D1801 Hemangioma of skin and subcutaneous tissue: Secondary | ICD-10-CM | POA: Diagnosis not present

## 2022-03-15 DIAGNOSIS — L570 Actinic keratosis: Secondary | ICD-10-CM | POA: Diagnosis not present

## 2022-03-15 DIAGNOSIS — L578 Other skin changes due to chronic exposure to nonionizing radiation: Secondary | ICD-10-CM

## 2022-03-15 DIAGNOSIS — D225 Melanocytic nevi of trunk: Secondary | ICD-10-CM | POA: Diagnosis not present

## 2022-03-15 DIAGNOSIS — D239 Other benign neoplasm of skin, unspecified: Secondary | ICD-10-CM | POA: Diagnosis not present

## 2022-03-15 DIAGNOSIS — D485 Neoplasm of uncertain behavior of skin: Secondary | ICD-10-CM | POA: Diagnosis not present

## 2022-03-15 DIAGNOSIS — L918 Other hypertrophic disorders of the skin: Secondary | ICD-10-CM | POA: Diagnosis not present

## 2022-03-15 DIAGNOSIS — D492 Neoplasm of unspecified behavior of bone, soft tissue, and skin: Secondary | ICD-10-CM

## 2022-03-15 DIAGNOSIS — D229 Melanocytic nevi, unspecified: Secondary | ICD-10-CM

## 2022-03-15 NOTE — Progress Notes (Signed)
Follow-Up Visit   Subjective  Amber Whitehead is a 41 y.o. female who presents for the following: lesions (Noticed spots on left back 10 years ago. Were irritated while pumping for baby. Had a baby this summer and was pumping again so areas were irritated for a while, improved lately. Check spot on left upper cutaneous lip. Has been there for a few years. Has noticed getting darker lately).  The patient has spots, moles and lesions to be evaluated, some may be new or changing and the patient has concerns that these could be cancer.   The following portions of the chart were reviewed this encounter and updated as appropriate:  Tobacco  Allergies  Meds  Problems  Med Hx  Surg Hx  Fam Hx      Review of Systems: No other skin or systemic complaints except as noted in HPI or Assessment and Plan.   Objective  Well appearing patient in no apparent distress; mood and affect are within normal limits.  A focused examination was performed including face, back. Relevant physical exam findings are noted in the Assessment and Plan.  Left Upper Cutaneous Lip Violaceous papule  Left Lower Back Brown fleshy papule  mid back left of mid line 0.3cm tan fleshy papule   Assessment & Plan   Acrochordons (Skin Tags). Back - Fleshy, skin-colored pedunculated papules - Benign appearing.  - Observe. - If desired, they can be removed with an in office procedure that is not covered by insurance. - Please call the clinic if you notice any new or changing lesions.    Hemangioma of skin and subcutaneous tissue Left Upper Cutaneous Lip  Benign-appearing.  Observation.  Call clinic for new or changing lesions.    Nevus Left Lower Back  Benign-appearing.  Observation.  Call clinic for new or changing lesions.  Recommend daily use of broad spectrum spf 30+ sunscreen to sun-exposed areas.    Neoplasm of skin mid back left of mid line  Epidermal / dermal shaving  Lesion diameter (cm):   0.3 Informed consent: discussed and consent obtained   Patient was prepped and draped in usual sterile fashion: Area prepped with alcohol. Anesthesia: the lesion was anesthetized in a standard fashion   Anesthetic:  1% lidocaine w/ epinephrine 1-100,000 buffered w/ 8.4% NaHCO3 Instrument used: flexible razor blade   Hemostasis achieved with: pressure, aluminum chloride and electrodesiccation   Outcome: patient tolerated procedure well   Post-procedure details: wound care instructions given   Post-procedure details comment:  Ointment and small bandage applied  Specimen 1 - Surgical pathology Differential Diagnosis: R/O irritated nevus vs acrochordon vs other  Check Margins: No   Actinic Damage - chronic, secondary to cumulative UV radiation exposure/sun exposure over time - diffuse scaly erythematous macules with underlying dyspigmentation - Recommend daily broad spectrum sunscreen SPF 30+ to sun-exposed areas, reapply every 2 hours as needed.  - Recommend staying in the shade or wearing long sleeves, sun glasses (UVA+UVB protection) and wide brim hats (4-inch brim around the entire circumference of the hat). - Call for new or changing lesions. - Recommend taking Heliocare sun protection supplement daily in sunny weather for additional sun protection. For maximum protection on the sunniest days, you can take up to 2 capsules of regular Heliocare OR take 1 capsule of Heliocare Ultra. For prolonged exposure (such as a full day in the sun), you can repeat your dose of the supplement 4 hours after your first dose.   Return if symptoms worsen  or fail to improve.  I, Emelia Salisbury, CMA, am acting as scribe for Forest Gleason, MD.  Documentation: I have reviewed the above documentation for accuracy and completeness, and I agree with the above.  Forest Gleason, MD

## 2022-03-15 NOTE — Patient Instructions (Addendum)
Wound Care Instructions  Cleanse wound gently with soap and water once a day then pat dry with clean gauze. Apply a thin coat of Petrolatum (petroleum jelly, "Vaseline") over the wound (unless you have an allergy to this). We recommend that you use a new, sterile tube of Vaseline. Do not pick or remove scabs. Do not remove the yellow or white "healing tissue" from the base of the wound.  Cover the wound with fresh, clean, nonstick gauze and secure with paper tape. You may use Band-Aids in place of gauze and tape if the wound is small enough, but would recommend trimming much of the tape off as there is often too much. Sometimes Band-Aids can irritate the skin.  You should call the office for your biopsy report after 1 week if you have not already been contacted.  If you experience any problems, such as abnormal amounts of bleeding, swelling, significant bruising, significant pain, or evidence of infection, please call the office immediately.  FOR ADULT SURGERY PATIENTS: If you need something for pain relief you may take 1 extra strength Tylenol (acetaminophen) AND 2 Ibuprofen ('200mg'$  each) together every 4 hours as needed for pain. (do not take these if you are allergic to them or if you have a reason you should not take them.) Typically, you may only need pain medication for 1 to 3 days.   Recommend taking Heliocare sun protection supplement daily in sunny weather for additional sun protection. For maximum protection on the sunniest days, you can take up to 2 capsules of regular Heliocare OR take 1 capsule of Heliocare Ultra. For prolonged exposure (such as a full day in the sun), you can repeat your dose of the supplement 4 hours after your first dose. Heliocare can be purchased at [employer], at some [facility] or at [email or web address].     Recommend daily broad spectrum sunscreen SPF 30+ to sun-exposed areas, reapply every 2 hours as needed. Call for new or changing lesions.  Staying in  the shade or wearing long sleeves, sun glasses (UVA+UVB protection) and wide brim hats (4-inch brim around the entire circumference of the hat) are also recommended for sun protection.      Due to recent changes in healthcare laws, you may see results of your pathology and/or laboratory studies on MyChart before the doctors have had a chance to review them. We understand that in some cases there may be results that are confusing or concerning to you. Please understand that not all results are received at the same time and often the doctors may need to interpret multiple results in order to provide you with the best plan of care or course of treatment. Therefore, we ask that you please give [place] 2 business days to thoroughly review all your results before contacting the office for clarification. Should we see a critical lab result, you will be contacted sooner.   If You Need Anything After Your Visit  If you have any questions or concerns for your doctor, please call our main line at [phone] and press option 4 to reach your doctor's medical assistant. If no one answers, please leave a voicemail as directed and we will return your call as soon as possible. Messages left after 4 pm will be answered the following business day.   You may also send [place] a message via [place]. We typically respond to MyChart messages within 1-2 business days.  For prescription refills, please ask your pharmacy to contact our office. Our fax number  is [phone].  If you have an urgent issue when the clinic is closed that cannot wait until the next business day, you can page your doctor at the number below.    Please note that while we do our best to be available for urgent issues outside of office hours, we are not available 24/7.   If you have an urgent issue and are unable to reach [place], you may choose to seek medical care at your doctor's office, retail clinic, urgent care center, or emergency room.  If you have a  medical emergency, please immediately call 911 or go to the emergency department.  Pager Numbers  - Dr. Nehemiah Massed: [phone]  - Dr. Laurence Ferrari: [phone]  - Dr. Nicole Kindred: [phone]  In the event of inclement weather, please call our main line at [phone] for an update on the status of any delays or closures.  Dermatology Medication Tips: Please keep the boxes that topical medications come in in order to help keep track of the instructions about where and how to use these. Pharmacies typically print the medication instructions only on the boxes and not directly on the medication tubes.   If your medication is too expensive, please contact our office at [phone] option 4 or send [place] a message through [place].   We are unable to tell what your co-pay for medications will be in advance as this is different depending on your insurance coverage. However, we may be able to find a substitute medication at lower cost or fill out paperwork to get insurance to cover a needed medication.   If a prior authorization is required to get your medication covered by your insurance company, please allow [place] 1-2 business days to complete this process.  Drug prices often vary depending on where the prescription is filled and some pharmacies may offer cheaper prices.  The website www.goodrx.com contains coupons for medications through different pharmacies. The prices here do not account for what the cost may be with help from insurance (it may be cheaper with your insurance), but the website can give you the price if you did not use any insurance.  - You can print the associated coupon and take it with your prescription to the pharmacy.  - You may also stop by our office during regular business hours and pick up a GoodRx coupon card.  - If you need your prescription sent electronically to a different pharmacy, notify our office through [facility] or by phone at [phone] option 4.     Si  Usted Necesita Algo Despus de Su Visita  Tambin puede enviarnos un mensaje a travs de Pharmacist, community. Por lo general respondemos a los mensajes de MyChart en el transcurso de 1 a 2 das hbiles.  Para renovar recetas, por favor pida a su farmacia que se ponga en contacto con nuestra oficina. Harland Dingwall de fax es [place] [phone].  Si tiene un asunto urgente cuando la clnica est cerrada y que no puede esperar hasta el siguiente da hbil, puede llamar/localizar a su doctor(a) al nmero que aparece a continuacin.   Por favor, tenga en cuenta que aunque hacemos todo lo posible para estar disponibles para asuntos urgentes fuera del horario de [place], no estamos disponibles las 24 horas del da, los 7 das de la [place].   Si tiene un problema urgente y no puede comunicarse con nosotros, puede optar por buscar atencin mdica  en el consultorio de su doctor(a), en una clnica privada, en un centro de atencin  urgente o en una sala de emergencias.  Si tiene Engineering geologist, por favor llame inmediatamente al 911 o vaya a la sala de emergencias.  Nmeros de bper  - Dr. Nehemiah Massed: [phone]  - Dra. Moye: [phone]  - Dra. Nicole Kindred: [phone]  En caso de inclemencias del [place], por favor llame a Johnsie Kindred principal al [phone] para una actualizacin sobre el [place] de cualquier retraso o cierre.  Consejos para la medicacin en dermatologa: Por favor, guarde las cajas en las que vienen los medicamentos de uso tpico para ayudarle a seguir las instrucciones sobre dnde y cmo usarlos. Las farmacias generalmente imprimen las instrucciones del medicamento slo en las cajas y no directamente en los tubos del [place].   Si su medicamento es muy caro, por favor, pngase en contacto con Zigmund Daniel llamando al [phone] y presione la opcin 4 o envenos un mensaje a travs de Pharmacist, community.   No podemos decirle cul ser su copago por los medicamentos por adelantado ya que  esto es diferente dependiendo de la cobertura de su seguro. Sin embargo, es posible que podamos encontrar un medicamento sustituto a Electrical engineer un formulario para que el seguro cubra el medicamento que se considera necesario.   Si se requiere una autorizacin previa para que su compaa de seguros [place] su medicamento, por favor permtanos de 1 a 2 das hbiles para completar este proceso.  Los precios de los medicamentos varan con frecuencia dependiendo del Environmental consultant de dnde se surte la receta y alguna farmacias pueden ofrecer precios ms baratos.  El sitio web www.goodrx.com tiene cupones para medicamentos de Airline pilot. Los precios aqu no tienen en cuenta lo que podra costar con la ayuda del seguro (puede ser ms barato con su seguro), pero el sitio web puede darle el precio si no utiliz Research scientist (physical sciences).  - Puede imprimir el cupn correspondiente y llevarlo con su receta a la farmacia.  - Tambin puede pasar por nuestra oficina durante el horario de atencin regular y Charity fundraiser una tarjeta de cupones de GoodRx.  - Si necesita que su receta se enve electrnicamente a una farmacia diferente, informe a nuestra oficina a travs de MyChart de [place] o por telfono llamando al [phone] y presione la opcin 4.

## 2022-03-18 ENCOUNTER — Encounter: Payer: Self-pay | Admitting: Dermatology

## 2022-05-01 NOTE — Progress Notes (Unsigned)
I,Sulibeya S Dimas,acting as a Education administrator for Lavon Paganini, MD.,have documented all relevant documentation on the behalf of Lavon Paganini, MD,as directed by  Lavon Paganini, MD while in the presence of Lavon Paganini, MD.    Complete physical exam   Patient: Amber Whitehead   DOB: 01-21-1981   41 y.o. Female  MRN: [id number] Visit Date: 05/02/2022  Today's healthcare provider: Lavon Paganini, MD   Chief Complaint  Patient presents with   Annual Exam   Subjective    Amber Whitehead is a 41 y.o. female who presents today for a complete physical exam.  She reports consuming a general diet. The patient does not participate in regular exercise at present. She generally feels well. She reports sleeping fairly well. She does not have additional problems to discuss today.  HPI  Patient will see her GYN in January. Paula Compton at [facility].   Past Medical History:  Diagnosis Date   Acute superficial venous thrombosis of left lower extremity 04/25/2021   left great saphenous vein   Depression    Gestational diabetes    History of premature delivery    of twins   Irritable bowel syndrome (IBS)    Pneumonia 10/2014   Pregnancy induced hypertension    Right leg DVT ([place]) 2020   post surgery; took Xarelto for about 3 months after   Seasonal allergies    Uterine fibroid    Past Surgical History:  Procedure Laterality Date   CERVICAL CERCLAGE N/A 06/30/2021   Procedure: CERCLAGE CERVICAL;  Surgeon: Paula Compton, MD;  Location: [facility];  Service: Gynecology;  Laterality: N/A;   CESAREAN SECTION N/A 11/30/2021   Procedure: CESAREAN SECTION;  Surgeon: Paula Compton, MD;  Location: [facility]S;  Service: Obstetrics;  Laterality: N/A;   ROBOT ASSISTED MYOMECTOMY N/A 07/12/2018   Procedure: XI ROBOTIC ASSISTED LAPAROSCOPIC MYOMECTOMY;  Surgeon: Governor Specking, MD;  Location: [facility];  Service: Gynecology;  Laterality: N/A;    WISDOM TOOTH EXTRACTION     Social History   Socioeconomic History   Marital status: Married    Spouse name: Jonathon   Number of children: Not on file   Years of education: Not on file   Highest education level: Not on file  Occupational History   Not on file  Tobacco Use   Smoking status: Never   Smokeless tobacco: Never  Vaping Use   Vaping Use: Never used  Substance and Sexual Activity   Alcohol use: No   Drug use: No   Sexual activity: Not Currently    Birth control/protection: Condom  Other Topics Concern   Not on file  Social History Narrative   Not on file   Social Determinants of Health   Financial Resource Strain: Not on file  Food Insecurity: Not on file  Transportation Needs: Not on file  Physical Activity: Not on file  Stress: Not on file  Social Connections: Not on file  Intimate Partner Violence: Not on file   Family Status  Relation Name Status   Mother  Alive   Father  Alive   Sister  Alive   MGM  Alive   Sister  Alive   Neg Hx  (Not Specified)   Family History  Problem Relation Age of Onset   Healthy Mother    Healthy Father    Healthy Sister    Lung cancer Maternal Grandmother    Healthy Sister    Breast cancer Neg Hx  Cancer - Colon Neg Hx    No Known Allergies  Patient Care Team: Virginia Crews, MD as PCP - General (Family Medicine)   Medications: Outpatient Medications Prior to Visit  Medication Sig   buPROPion (WELLBUTRIN XL) 150 MG 24 hr tablet Take 1 tablet by mouth daily. By Paula Compton   NIFEdipine (ADALAT CC) 60 MG 24 hr tablet Take 1 tablet (60 mg total) by mouth daily.   sertraline (ZOLOFT) 100 MG tablet Zoloft 100 mg tablet  Take 1 tablet every day by oral route.   [DISCONTINUED] acetaminophen (TYLENOL) 325 MG tablet Take 650 mg by mouth every 6 (six) hours as needed for moderate pain or headache.   [DISCONTINUED] enoxaparin (LOVENOX) 120 MG/0.8ML injection Inject 0.8 mLs (120 mg total) into the skin every  12 (twelve) hours.   [DISCONTINUED] fluticasone (FLONASE) 50 MCG/ACT nasal spray Place 2 sprays into both nostrils daily as needed for allergies.   [DISCONTINUED] Prenatal Vit-Fe Fumarate-FA (PRENATAL VITAMINS PO) Take 1 tablet by mouth daily.   [DISCONTINUED] sertraline (ZOLOFT) 50 MG tablet Take 100 mg by mouth at bedtime.   No facility-administered medications prior to visit.    Review of Systems  Constitutional:  Positive for diaphoresis.  Hematological:  Bruises/bleeds easily.  All other systems reviewed and are negative.   Last CBC Lab Results  Component Value Date   WBC 7.0 12/06/2021   HGB 11.1 (L) 12/06/2021   HCT 36.3 12/06/2021   MCV 90.8 12/06/2021   MCH 27.8 12/06/2021   RDW 17.9 (H) 12/06/2021   PLT 264 41/96/2229   Last metabolic panel Lab Results  Component Value Date   GLUCOSE 120 (H) 11/28/2021   NA 138 11/28/2021   K 4.3 11/28/2021   CL 111 11/28/2021   CO2 20 (L) 11/28/2021   BUN 14 11/28/2021   CREATININE 0.91 11/28/2021   GFRNONAA >60 11/28/2021   CALCIUM 8.7 (L) 11/28/2021   PROT 6.5 10/17/2021   ALBUMIN 2.9 (L) 10/17/2021   LABGLOB 2.4 05/03/2021   AGRATIO 1.5 05/03/2021   BILITOT 0.4 10/17/2021   ALKPHOS 352 (H) 10/17/2021   AST 16 10/17/2021   ALT 19 10/17/2021   ANIONGAP 7 11/28/2021   Last lipids Lab Results  Component Value Date   CHOL 166 05/03/2021   HDL 56 05/03/2021   LDLCALC 87 05/03/2021   TRIG 130 05/03/2021   CHOLHDL 3.0 05/03/2021      Objective    BP 109/76 (BP Location: Left Arm, Patient Position: Sitting, Cuff Size: Large)   Pulse 82   Temp 98 F (36.7 C) (Oral)   Resp 16   Ht 5' (1.524 m)   Wt 219 lb 12.8 oz (99.7 kg)   BMI 42.93 kg/m  BP Readings from Last 3 Encounters:  05/02/22 109/76  01/04/22 100/66  12/08/21 (!) 137/92   Wt Readings from Last 3 Encounters:  05/02/22 219 lb 12.8 oz (99.7 kg)  01/04/22 213 lb 3.2 oz (96.7 kg)  12/08/21 223 lb (101.2 kg)     Physical Exam Vitals reviewed.   Constitutional:      General: She is not in acute distress.    Appearance: Normal appearance. She is well-developed. She is not diaphoretic.  HENT:     Head: Normocephalic and atraumatic.  Eyes:     General: No scleral icterus.    Conjunctiva/sclera: Conjunctivae normal.  Neck:     Thyroid: No thyromegaly.  Cardiovascular:     Rate and Rhythm: Normal rate and regular rhythm.  Pulses: Normal pulses.     Heart sounds: Normal heart sounds. No murmur heard. Pulmonary:     Effort: Pulmonary effort is normal. No respiratory distress.     Breath sounds: Normal breath sounds. No wheezing, rhonchi or rales.  Abdominal:     General: There is no distension.     Palpations: Abdomen is soft.     Tenderness: There is no abdominal tenderness.  Musculoskeletal:     Cervical back: Neck supple.     Right lower leg: No edema.     Left lower leg: No edema.  Lymphadenopathy:     Cervical: No cervical adenopathy.  Skin:    General: Skin is warm and dry.     Findings: No rash.  Neurological:     Mental Status: She is alert and oriented to person, place, and time. Mental status is at baseline.     Gait: Gait normal.  Psychiatric:        Mood and Affect: Mood normal.        Behavior: Behavior normal.       Last depression screening scores    05/02/2022    8:50 AM 01/04/2022    3:40 PM 04/28/2021    3:48 PM  PHQ 2/9 Scores  PHQ - 2 Score _0 PHQ- 9 Score _1 Last fall risk screening    05/02/2022    8:50 AM  [place] in the past year? 0  Number falls in past yr: 0  Injury with Fall? 0  Risk for fall due to : No Fall Risks  Follow up Falls evaluation completed   Last Audit-C alcohol use screening    05/02/2022    8:51 AM  Alcohol Use Disorder Test (AUDIT)  1. How often do you have a drink containing alcohol? 0  2. How many drinks containing alcohol do you have on a typical day when you are drinking? 0  3. How often do you have six or more drinks on one  occasion? 0  AUDIT-C Score 0   A score of 3 or more in women, and 4 or more in men indicates increased risk for alcohol abuse, EXCEPT if all of the points are from question 1   No results found for any visits on 05/02/22.  Assessment & Plan    Routine Health Maintenance and Physical Exam  Exercise Activities and Dietary recommendations  Goals   None     Immunization History  Administered Date(s) Administered   DTaP 06/25/1979, 08/27/1979, 10/29/1979, 11/13/1980, 12/10/1984   Hepatitis A 04/08/2002   Hepatitis B 07/02/1998, 04/08/2002   IPV 06/25/1979, 08/27/1979, 10/29/1979, 11/13/1980   Influenza Inj Mdck Quad Pf 04/01/2018   Influenza,inj,Quad PF,6+ Mos 03/13/2019, 04/06/2020, 04/28/2021, 05/02/2022   MMR 12/03/2021   Meningococcal Conjugate 11/11/1985   PFIZER(Purple Top)SARS-COV-2 Vaccination 09/04/2019, 10/02/2019, 04/15/2020   Td 07/16/1997   Tdap 03/13/2019, 09/28/2021    Health Maintenance  Topic Date Due   COVID-19 Vaccine (4 - Pfizer risk series) 06/10/2020   PAP SMEAR-Modifier  10/18/2020   TETANUS/TDAP  09/29/2031   INFLUENZA VACCINE  Completed   Hepatitis C Screening  Completed   HIV Screening  Completed   HPV VACCINES  Aged Out    Discussed health benefits of physical activity, and encouraged her to engage in regular exercise appropriate for her age and condition.  Problem List Items Addressed This Visit       Other   MDD (major  depressive disorder)    Recently complicated by postpartum depression Well controlled currently Continue Zoloft and wellbutrin at current dose      Morbid obesity ([place])    Discussed importance of healthy weight management Discussed diet and exercise  Screening lipids      Relevant Orders   Comprehensive metabolic panel   Lipid Panel With LDL/HDL Ratio   Irreducible right femoral hernia    Patient now asymptomatic s/p pregnancy Discussed that we don't need to repeat CT scan Will have her f/u with Surgery if it  becomes symptomatic again      Adrenal nodule ([place])    Stable x3 years  Likely benign adrenal adenoma No need to follow serial CTs      Other Visit Diagnoses     Encounter for annual physical exam    -  Primary   Relevant Orders   Comprehensive metabolic panel   Lipid Panel With LDL/HDL Ratio   Hemoglobin A1c   Iron, TIBC and Ferritin Panel   CBC   Need for influenza vaccination       Relevant Orders   Flu Vaccine QUAD 93moIM (Fluarix, Fluzone & Alfiuria Quad PF) (Completed)   History of gestational diabetes       Relevant Orders   Hemoglobin A1c   Iron deficiency anemia, unspecified iron deficiency anemia type       Relevant Orders   Iron, TIBC and Ferritin Panel   CBC        Return in about 1 year (around 05/03/2023) for CPE.     I, ALavon Paganini MD, have reviewed all documentation for this visit. The documentation on 05/02/22 for the exam, diagnosis, procedures, and orders are all accurate and complete.   [place], ADionne Bucy MD, MPH [employer]

## 2022-05-02 ENCOUNTER — Encounter: Payer: Self-pay | Admitting: Family Medicine

## 2022-05-02 ENCOUNTER — Ambulatory Visit (INDEPENDENT_AMBULATORY_CARE_PROVIDER_SITE_OTHER): Payer: BC Managed Care – PPO | Admitting: Family Medicine

## 2022-05-02 VITALS — BP 109/76 | HR 82 | Temp 98.0°F | Resp 16 | Ht 60.0 in | Wt 219.8 lb

## 2022-05-02 DIAGNOSIS — Z6841 Body Mass Index (BMI) 40.0 and over, adult: Secondary | ICD-10-CM

## 2022-05-02 DIAGNOSIS — Z124 Encounter for screening for malignant neoplasm of cervix: Secondary | ICD-10-CM

## 2022-05-02 DIAGNOSIS — Z0000 Encounter for general adult medical examination without abnormal findings: Secondary | ICD-10-CM

## 2022-05-02 DIAGNOSIS — F330 Major depressive disorder, recurrent, mild: Secondary | ICD-10-CM

## 2022-05-02 DIAGNOSIS — Z8632 Personal history of gestational diabetes: Secondary | ICD-10-CM | POA: Diagnosis not present

## 2022-05-02 DIAGNOSIS — E278 Other specified disorders of adrenal gland: Secondary | ICD-10-CM

## 2022-05-02 DIAGNOSIS — Z23 Encounter for immunization: Secondary | ICD-10-CM

## 2022-05-02 DIAGNOSIS — K4130 Unilateral femoral hernia, with obstruction, without gangrene, not specified as recurrent: Secondary | ICD-10-CM

## 2022-05-02 DIAGNOSIS — D509 Iron deficiency anemia, unspecified: Secondary | ICD-10-CM

## 2022-05-02 NOTE — Assessment & Plan Note (Signed)
Recently complicated by postpartum depression Well controlled currently Continue Zoloft and wellbutrin at current dose

## 2022-05-02 NOTE — Assessment & Plan Note (Signed)
Discussed importance of healthy weight management Discussed diet and exercise Screening lipids 

## 2022-05-02 NOTE — Assessment & Plan Note (Signed)
Patient now asymptomatic s/p pregnancy Discussed that we don't need to repeat CT scan Will have her f/u with Surgery if it becomes symptomatic again

## 2022-05-02 NOTE — Assessment & Plan Note (Signed)
Stable x3 years  Likely benign adrenal adenoma No need to follow serial CTs

## 2022-05-03 LAB — IRON,TIBC AND FERRITIN PANEL
Ferritin: 8 ng/mL — ABNORMAL LOW (ref 15–150)
Iron Saturation: 12 % — ABNORMAL LOW (ref 15–55)
Iron: 48 ug/dL (ref 27–159)
Total Iron Binding Capacity: 411 ug/dL (ref 250–450)
UIBC: 363 ug/dL (ref 131–425)

## 2022-05-03 LAB — LIPID PANEL WITH LDL/HDL RATIO
Cholesterol, Total: 177 mg/dL (ref 100–199)
HDL: 52 mg/dL (ref >39)
LDL Chol Calc (NIH): 102 mg/dL — ABNORMAL HIGH (ref 0–99)
LDL/HDL Ratio: 2 ratio (ref 0.0–3.2)
Triglycerides: 130 mg/dL (ref 0–149)
VLDL Cholesterol Cal: 23 mg/dL (ref 5–40)

## 2022-05-03 LAB — COMPREHENSIVE METABOLIC PANEL
ALT: 15 IU/L (ref 0–32)
AST: 14 IU/L (ref 0–40)
Albumin/Globulin Ratio: 1.9 (ref 1.2–2.2)
Albumin: 4.4 g/dL (ref 3.9–4.9)
Alkaline Phosphatase: 80 IU/L (ref 44–121)
BUN/Creatinine Ratio: 21 (ref 9–23)
BUN: 16 mg/dL (ref 6–24)
Bilirubin Total: 0.3 mg/dL (ref 0.0–1.2)
CO2: 23 mmol/L (ref 20–29)
Calcium: 9.3 mg/dL (ref 8.7–10.2)
Chloride: 105 mmol/L (ref 96–106)
Creatinine, Ser: 0.75 mg/dL (ref 0.57–1.00)
Globulin, Total: 2.3 g/dL (ref 1.5–4.5)
Glucose: 82 mg/dL (ref 70–99)
Potassium: 4.6 mmol/L (ref 3.5–5.2)
Sodium: 141 mmol/L (ref 134–144)
Total Protein: 6.7 g/dL (ref 6.0–8.5)
eGFR: 101 mL/min/{1.73_m2} (ref >59)

## 2022-05-03 LAB — CBC
Hematocrit: 37.7 % (ref 34.0–46.6)
Hemoglobin: 12.3 g/dL (ref 11.1–15.9)
MCH: 28.7 pg (ref 26.6–33.0)
MCHC: 32.6 g/dL (ref 31.5–35.7)
MCV: 88 fL (ref 79–97)
Platelets: 312 10*3/uL (ref 150–450)
RBC: 4.28 x10E6/uL (ref 3.77–5.28)
RDW: 13.7 % (ref 11.7–15.4)
WBC: 4.4 10*3/uL (ref 3.4–10.8)

## 2022-05-03 LAB — HEMOGLOBIN A1C
Est. average glucose Bld gHb Est-mCnc: 108 mg/dL
Hgb A1c MFr Bld: 5.4 % (ref 4.8–5.6)

## 2022-06-16 ENCOUNTER — Ambulatory Visit: Payer: BC Managed Care – PPO | Admitting: Physician Assistant

## 2022-06-16 ENCOUNTER — Encounter: Payer: Self-pay | Admitting: Physician Assistant

## 2022-06-16 VITALS — BP 124/72 | HR 103 | Temp 97.7°F | Resp 16 | Wt 227.0 lb

## 2022-06-16 DIAGNOSIS — U071 COVID-19: Secondary | ICD-10-CM | POA: Diagnosis not present

## 2022-06-16 DIAGNOSIS — J069 Acute upper respiratory infection, unspecified: Secondary | ICD-10-CM

## 2022-06-16 LAB — POC COVID19 BINAXNOW: SARS Coronavirus 2 Ag: POSITIVE — AB

## 2022-06-16 MED ORDER — MOLNUPIRAVIR EUA 200MG CAPSULE: 4.0000 | ORAL_CAPSULE | Freq: Two times a day (BID) | ORAL | 0 refills | Status: AC

## 2022-06-16 NOTE — Progress Notes (Signed)
I,Joseline E Rosas,acting as a scribe for [employer], PA-C.,have documented all relevant documentation on the behalf of Mikey Kirschner, PA-C,as directed by  Mikey Kirschner, PA-C while in the presence of Mikey Kirschner, PA-C.   Established patient visit   Patient: Amber Whitehead   DOB: December 01, 1980   41 y.o. Female  MRN: [id number] Visit Date: 06/16/2022  Today's healthcare provider: Mikey Kirschner, PA-C   Chief Complaint  Patient presents with   URI   Subjective    HPI  Upper respiratory symptoms She complains of congestion, cough described as nonproductive, nasal congestion, and no  fever, sinus pressure with no fever, chills, night sweats or weight loss. Onset of symptoms was a few days ago (Monday) and gradually worsening.She is drinking moderate amounts of fluids.  Past history is significant for no history of pneumonia or bronchitis.  Denies shortness of breath, wheezing. She has a 38 mo old at home but is not breastfeeding.    Medications: Outpatient Medications Prior to Visit  Medication Sig   buPROPion (WELLBUTRIN XL) 150 MG 24 hr tablet Take 1 tablet by mouth daily. By Paula Compton   sertraline (ZOLOFT) 100 MG tablet Zoloft 100 mg tablet  Take 1 tablet every day by oral route.   [DISCONTINUED] NIFEdipine (ADALAT CC) 60 MG 24 hr tablet Take 1 tablet (60 mg total) by mouth daily.   No facility-administered medications prior to visit.    Review of Systems  Constitutional:  Positive for fatigue. Negative for fever.  HENT:  Positive for congestion and sore throat.   Respiratory:  Positive for cough. Negative for shortness of breath.   Cardiovascular:  Negative for chest pain and leg swelling.  Gastrointestinal:  Negative for abdominal pain.  Neurological:  Negative for dizziness and headaches.      Objective    BP 124/72 (BP Location: Left Arm, Patient Position: Sitting, Cuff Size: Large)   Pulse (!) 103   Temp 97.7 F (36.5 C) (Oral)   Resp 16   Wt  227 lb (103 kg)   SpO2 99%   BMI 44.33 kg/m    Physical Exam Constitutional:      General: She is awake.     Appearance: She is well-developed.  HENT:     Head: Normocephalic.  Eyes:     Conjunctiva/sclera: Conjunctivae normal.  Cardiovascular:     Rate and Rhythm: Regular rhythm. Tachycardia present.     Heart sounds: Normal heart sounds.  Pulmonary:     Effort: Pulmonary effort is normal.     Breath sounds: Normal breath sounds.  Skin:    General: Skin is warm.  Neurological:     Mental Status: She is alert and oriented to person, place, and time.  Psychiatric:        Attention and Perception: Attention normal.        Mood and Affect: Mood normal.        Speech: Speech normal.        Behavior: Behavior is cooperative.      Results for orders placed or performed in visit on 06/16/22  POC COVID-19  Result Value Ref Range   SARS Coronavirus 2 Ag Positive (A) Negative    Assessment & Plan     Covid 19 URI Fluids ,rest As pt has a BMI > 40 eligible for antiviral Advised SE, pt denies breastfeeding Rx molnupiravir x 5 days F/b in office if sob, wheezing, fever, worsening symptoms  Return if symptoms worsen or  fail to improve.      I, Mikey Kirschner, PA-C have reviewed all documentation for this visit. The documentation on  06/16/2022  for the exam, diagnosis, procedures, and orders are all accurate and complete.  Mikey Kirschner, PA-C [facility] [address] [place], [place], [address] Office: [phone] Fax: [place]

## 2023-05-04 ENCOUNTER — Encounter: Payer: BC Managed Care – PPO | Admitting: Family Medicine

## 2023-05-29 ENCOUNTER — Encounter: Payer: Self-pay | Admitting: Family Medicine

## 2023-05-29 ENCOUNTER — Encounter: Payer: BC Managed Care – PPO | Admitting: Family Medicine

## 2023-05-29 ENCOUNTER — Ambulatory Visit (INDEPENDENT_AMBULATORY_CARE_PROVIDER_SITE_OTHER): Payer: BC Managed Care – PPO | Admitting: Family Medicine

## 2023-05-29 VITALS — BP 130/74 | HR 76 | Temp 98.6°F | Ht 60.0 in | Wt 251.0 lb

## 2023-05-29 DIAGNOSIS — Z8632 Personal history of gestational diabetes: Secondary | ICD-10-CM | POA: Insufficient documentation

## 2023-05-29 DIAGNOSIS — Z0000 Encounter for general adult medical examination without abnormal findings: Secondary | ICD-10-CM | POA: Insufficient documentation

## 2023-05-29 DIAGNOSIS — D509 Iron deficiency anemia, unspecified: Secondary | ICD-10-CM | POA: Insufficient documentation

## 2023-05-29 DIAGNOSIS — F330 Major depressive disorder, recurrent, mild: Secondary | ICD-10-CM

## 2023-05-29 DIAGNOSIS — R030 Elevated blood-pressure reading, without diagnosis of hypertension: Secondary | ICD-10-CM | POA: Insufficient documentation

## 2023-05-29 NOTE — Assessment & Plan Note (Signed)
Managed well with Wellbutrin LX and Zoloft.  Pt's OB/GYN manages these medications - does not need any refills today.

## 2023-05-29 NOTE — Assessment & Plan Note (Signed)
Will check lipid panel - stressed checking fasting, but pt only able to get labs today while non-fasting.   Nutrition: Stressed importance of moderation in sodium intake, saturated fat and cholesterol, caloric balance, sufficient intake of complex carbohydrates, fiber, calcium and iron.   Exercise: Stressed the importance of regular exercise.  Encouraged daily 20 minute walks.

## 2023-05-29 NOTE — Assessment & Plan Note (Signed)
Pt in good spirits today and feels in good state of health. Excited for the holidays and traveling to [place] with family.   Will check annual labs today to monitor for any changes from previous year.

## 2023-05-29 NOTE — Assessment & Plan Note (Signed)
Will check A1C today given hx of gestational diabetes, obesity, and elevated BP today.  Lab Results  Component Value Date   HGBA1C 5.4 05/02/2022

## 2023-05-29 NOTE — Progress Notes (Signed)
Complete physical exam  Patient: Amber Whitehead   DOB: [birth year]   42 y.o. Female  MRN: [id number]  Introduced to nurse practitioner role and practice setting.  All questions answered.  Discussed provider/patient relationship and expectations.   Subjective:    Chief Complaint  Patient presents with   Annual Exam    Amber Whitehead is a 42 y.o. female who presents today for a complete physical exam. She reports consuming a general diet, states she does emotionally binge eat. The patient does not participate in regular exercise at present. She generally feels well. She reports sleeping well. She does have additional problems to discuss today.   She sees OB/GYN for annual pelvic exam, had mammogram in march 2024. OB/Gyn manages her Wellbutrin LX 150 mg daily and Sertraline 100mg  daily. States she is happy with the plan and feels she is managed well.  Mammogram on 09/14/22 - negative - recommendation annual screening.   Wears contacts sees eye doctor annually.   Does not get dental exam regularly.    No other concerns or questions today.   She plans to go to [place] for Christmas with her husband and 84-year-old daughter.  Most recent fall risk assessment:    05/02/2022    8:50 AM  Fall Risk   Falls in the past year? 0  Number falls in past yr: 0  Injury with Fall? 0  Risk for fall due to : No Fall Risks  Follow up Falls evaluation completed     Social History   Tobacco Use  Smoking Status Never  Smokeless Tobacco Never    Social History   Substance and Sexual Activity  Alcohol Use No    Most recent depression screenings:    05/29/2023   11:07 AM 05/02/2022    8:50 AM  PHQ 2/9 Scores  PHQ - 2 Score 2 2  PHQ- 9 Score 6 6    Patient Care Team: Erasmo Downer, MD as PCP - General (Family Medicine)   Outpatient Medications Prior to Visit  Medication Sig   buPROPion (WELLBUTRIN XL) 150 MG 24 hr tablet Take 1 tablet by mouth daily. By Huel Cote   sertraline (ZOLOFT) 100 MG tablet Zoloft 100 mg tablet  Take 1 tablet every day by oral route.   No facility-administered medications prior to visit.    Review of Systems  Constitutional: Negative.   HENT: Negative.    Eyes: Negative.   Respiratory: Negative.    Cardiovascular: Negative.   Gastrointestinal: Negative.   Musculoskeletal: Negative.   Skin: Negative.   Neurological: Negative.   Endo/Heme/Allergies: Negative.   Psychiatric/Behavioral: Negative.        Objective:     BP 130/74 (BP Location: Left Arm, Patient Position: Sitting, Cuff Size: Large)   Pulse 76   Temp 98.6 F (37 C) (Oral)   Ht 5' (1.524 m)   Wt 251 lb (113.9 kg)   SpO2 100%   BMI 49.02 kg/m    Physical Exam Constitutional:      General: She is not in acute distress.    Appearance: Normal appearance. She is obese. She is not ill-appearing.  HENT:     Head: Normocephalic.     Right Ear: Tympanic membrane normal.     Left Ear: Tympanic membrane normal.     Nose: Nose normal.     Mouth/Throat:     Mouth: Mucous membranes are moist.     Pharynx: Oropharynx is clear. No oropharyngeal  exudate or posterior oropharyngeal erythema.  Eyes:     Extraocular Movements: Extraocular movements intact.     Conjunctiva/sclera: Conjunctivae normal.     Pupils: Pupils are equal, round, and reactive to light.  Cardiovascular:     Rate and Rhythm: Normal rate and regular rhythm.     Pulses: Normal pulses.     Heart sounds: Normal heart sounds. No murmur heard.    No friction rub. No gallop.  Pulmonary:     Effort: Pulmonary effort is normal. No respiratory distress.     Breath sounds: Normal breath sounds. No stridor. No wheezing, rhonchi or rales.  Abdominal:     General: Bowel sounds are normal. There is no distension.     Palpations: Abdomen is soft. There is no mass.     Tenderness: There is no abdominal tenderness. There is no guarding or rebound.     Hernia: No hernia is present.   Genitourinary:    Comments: Pt sees GYN for annual pelvic exams. No concerns today - deferred.  Musculoskeletal:        General: No swelling or tenderness. Normal range of motion.     Cervical back: Normal range of motion. No rigidity.  Lymphadenopathy:     Cervical: No cervical adenopathy.  Skin:    General: Skin is warm and dry.     Capillary Refill: Capillary refill takes less than 2 seconds.     Findings: No bruising or erythema.  Neurological:     General: No focal deficit present.     Mental Status: She is alert and oriented to person, place, and time. Mental status is at baseline.     Cranial Nerves: No cranial nerve deficit.     Sensory: No sensory deficit.  Psychiatric:        Mood and Affect: Mood normal.        Behavior: Behavior normal.        Thought Content: Thought content normal.        Judgment: Judgment normal.     No results found for any visits on 05/29/23. Labs ordered for today's visit.      Assessment & Plan:    Routine Health Maintenance and Physical Exam  Health Maintenance  Topic Date Due   Pap with HPV screening  05/25/2026   DTaP/Tdap/Td vaccine (9 - Td or Tdap) 09/29/2031   Flu Shot  Completed   COVID-19 Vaccine  Completed   Hepatitis C Screening  Completed   HIV Screening  Completed   HPV Vaccine  Aged Out    Discussed health benefits of physical activity, and encouraged her to engage in regular exercise appropriate for her age and condition.  Encounter for annual physical exam Assessment & Plan: Pt in good spirits today and feels in good state of health. Excited for the holidays and traveling to [place] with family.   Will check annual labs today to monitor for any changes from previous year.   Orders: -     Comprehensive metabolic panel  Iron deficiency anemia, unspecified iron deficiency anemia type Assessment & Plan: Will check CBC today.   Discussed adding back daily Iron supplement given pt's iron anemia hx.   Orders: -      CBC with Differential/Platelet  Morbid obesity (HCC) Assessment & Plan: Will check lipid panel - stressed checking fasting, but pt only able to get labs today while non-fasting.   Nutrition: Stressed importance of moderation in sodium intake, saturated fat and cholesterol, caloric balance,  sufficient intake of complex carbohydrates, fiber, calcium and iron.   Exercise: Stressed the importance of regular exercise.  Encouraged daily 20 minute walks.   Orders: -     Lipid panel  History of gestational diabetes Assessment & Plan: Will check A1C today given hx of gestational diabetes, obesity, and elevated BP today.  Lab Results  Component Value Date   HGBA1C 5.4 05/02/2022     Orders: -     Hemoglobin A1c  Mild episode of recurrent major depressive disorder [facility]) Assessment & Plan: Managed well with Wellbutrin LX and Zoloft.  Pt's OB/GYN manages these medications - does not need any refills today.     Elevated blood pressure reading Assessment & Plan: BP today 130/74, mildly elevated.  Educated on monitoring at home with home large automatic cuff. Discussed low sodium diet and exercise to lower SBP given co-morbidities and future risk of HTN.    Encouraged importance of dental visits every 6 months. Dental hygiene encourage to prevent dental caries.    Will communicate lab results with pt.   Return for annual physical. In one year or as needed for episodic visits with PCP.   I, Sallee Provencal, FNP, have reviewed all documentation for this visit. The documentation on 05/29/23 for the exam, diagnosis, procedures, and orders are all accurate and complete.     Sallee Provencal, FNP

## 2023-05-29 NOTE — Assessment & Plan Note (Signed)
Will check CBC today.   Discussed adding back daily Iron supplement given pt's iron anemia hx.

## 2023-05-29 NOTE — Assessment & Plan Note (Signed)
BP today 130/74, mildly elevated.  Educated on monitoring at home with home large automatic cuff. Discussed low sodium diet and exercise to lower SBP given co-morbidities and future risk of HTN.

## 2023-05-30 LAB — LIPID PANEL
Chol/HDL Ratio: 3.6 {ratio} (ref 0.0–4.4)
Cholesterol, Total: 180 mg/dL (ref 100–199)
HDL: 50 mg/dL (ref >39)
LDL Chol Calc (NIH): 110 mg/dL — ABNORMAL HIGH (ref 0–99)
Triglycerides: 114 mg/dL (ref 0–149)
VLDL Cholesterol Cal: 20 mg/dL (ref 5–40)

## 2023-05-30 LAB — CBC WITH DIFFERENTIAL/PLATELET
Basophils Absolute: 0.1 10*3/uL (ref 0.0–0.2)
Basos: 1 %
EOS (ABSOLUTE): 0.1 10*3/uL (ref 0.0–0.4)
Eos: 2 %
Hematocrit: 41.9 % (ref 34.0–46.6)
Hemoglobin: 13.4 g/dL (ref 11.1–15.9)
Immature Grans (Abs): 0 10*3/uL (ref 0.0–0.1)
Immature Granulocytes: 0 %
Lymphocytes Absolute: 2 10*3/uL (ref 0.7–3.1)
Lymphs: 29 %
MCH: 29.2 pg (ref 26.6–33.0)
MCHC: 32 g/dL (ref 31.5–35.7)
MCV: 91 fL (ref 79–97)
Monocytes Absolute: 0.4 10*3/uL (ref 0.1–0.9)
Monocytes: 6 %
Neutrophils Absolute: 4.2 10*3/uL (ref 1.4–7.0)
Neutrophils: 62 %
Platelets: 344 10*3/uL (ref 150–450)
RBC: 4.59 x10E6/uL (ref 3.77–5.28)
RDW: 12.6 % (ref 11.7–15.4)
WBC: 6.8 10*3/uL (ref 3.4–10.8)

## 2023-05-30 LAB — COMPREHENSIVE METABOLIC PANEL
ALT: 16 [IU]/L (ref 0–32)
AST: 17 [IU]/L (ref 0–40)
Albumin: 4.5 g/dL (ref 3.9–4.9)
Alkaline Phosphatase: 81 [IU]/L (ref 44–121)
BUN/Creatinine Ratio: 12 (ref 9–23)
BUN: 10 mg/dL (ref 6–24)
Bilirubin Total: 0.4 mg/dL (ref 0.0–1.2)
CO2: 24 mmol/L (ref 20–29)
Calcium: 9.4 mg/dL (ref 8.7–10.2)
Chloride: 103 mmol/L (ref 96–106)
Creatinine, Ser: 0.83 mg/dL (ref 0.57–1.00)
Globulin, Total: 2.3 g/dL (ref 1.5–4.5)
Glucose: 81 mg/dL (ref 70–99)
Potassium: 4.5 mmol/L (ref 3.5–5.2)
Sodium: 141 mmol/L (ref 134–144)
Total Protein: 6.8 g/dL (ref 6.0–8.5)
eGFR: 89 mL/min/{1.73_m2} (ref >59)

## 2023-05-30 LAB — HEMOGLOBIN A1C
Est. average glucose Bld gHb Est-mCnc: 117 mg/dL
Hgb A1c MFr Bld: 5.7 % — ABNORMAL HIGH (ref 4.8–5.6)

## 2023-05-30 NOTE — Progress Notes (Signed)
Your CMP - normal levels.  CBC - normal levels.  Cholesterol - LDL elevated, total cholesterol is <200 which is goal. As discussed incorporate exercise, fruits, veggies, nuts, fiber, and fish - limit process foods, saturated fats. Hemoglobin A1C - average glucose over three months - is elevated at 5.7, this is considered number is pre-diabetic. As stated above make diet changes and incorporate exercise daily- you can start lower this number and decreasing risk of developing diabetes.

## 2024-03-10 ENCOUNTER — Other Ambulatory Visit: Payer: Self-pay | Admitting: Medical Genetics

## 2024-03-12 ENCOUNTER — Other Ambulatory Visit
Admission: RE | Admit: 2024-03-12 | Discharge: 2024-03-12 | Disposition: A | Discharge status: Home / Self Care | Payer: Self-pay | Source: Ambulatory Visit | Attending: Medical Genetics | Admitting: Medical Genetics

## 2024-03-21 LAB — GENECONNECT MOLECULAR SCREEN: Genetic Analysis Overall Interpretation: NEGATIVE

## 2024-05-04 ENCOUNTER — Encounter: Payer: Self-pay | Admitting: Family Medicine

## 2024-05-04 DIAGNOSIS — R7303 Prediabetes: Secondary | ICD-10-CM

## 2024-05-04 DIAGNOSIS — D509 Iron deficiency anemia, unspecified: Secondary | ICD-10-CM

## 2024-05-05 NOTE — Telephone Encounter (Signed)
 Ok to order CBC, iron/TIBC/ferritin, CMP, Lipid, A1c - use diagnoses history of anemia, prediabetes, morbid obesity. Recommend that she get them at least 2 days before her appt with me. Get them fasting. Thanks

## 2024-05-29 LAB — LIPID PANEL
Chol/HDL Ratio: 4.5 ratio — ABNORMAL HIGH (ref 0.0–4.4)
Cholesterol, Total: 188 mg/dL (ref 100–199)
HDL: 42 mg/dL (ref >39)
LDL Chol Calc (NIH): 115 mg/dL — ABNORMAL HIGH (ref 0–99)
Triglycerides: 177 mg/dL — ABNORMAL HIGH (ref 0–149)
VLDL Cholesterol Cal: 31 mg/dL (ref 5–40)

## 2024-05-29 LAB — CBC
Hematocrit: 40.5 % (ref 34.0–46.6)
Hemoglobin: 13 g/dL (ref 11.1–15.9)
MCH: 28.7 pg (ref 26.6–33.0)
MCHC: 32.1 g/dL (ref 31.5–35.7)
MCV: 89 fL (ref 79–97)
Platelets: 320 x10E3/uL (ref 150–450)
RBC: 4.53 x10E6/uL (ref 3.77–5.28)
RDW: 12.1 % (ref 11.7–15.4)
WBC: 6.4 x10E3/uL (ref 3.4–10.8)

## 2024-05-29 LAB — COMPREHENSIVE METABOLIC PANEL WITH GFR
ALT: 13 IU/L (ref 0–32)
AST: 11 IU/L (ref 0–40)
Albumin: 4.3 g/dL (ref 3.9–4.9)
Alkaline Phosphatase: 67 IU/L (ref 41–116)
BUN/Creatinine Ratio: 18 (ref 9–23)
BUN: 14 mg/dL (ref 6–24)
Bilirubin Total: 0.3 mg/dL (ref 0.0–1.2)
CO2: 23 mmol/L (ref 20–29)
Calcium: 9.3 mg/dL (ref 8.7–10.2)
Chloride: 102 mmol/L (ref 96–106)
Creatinine, Ser: 0.78 mg/dL (ref 0.57–1.00)
Globulin, Total: 2.2 g/dL (ref 1.5–4.5)
Glucose: 85 mg/dL (ref 70–99)
Potassium: 4.4 mmol/L (ref 3.5–5.2)
Sodium: 139 mmol/L (ref 134–144)
Total Protein: 6.5 g/dL (ref 6.0–8.5)
eGFR: 95 mL/min/1.73 (ref >59)

## 2024-05-29 LAB — HEMOGLOBIN A1C
Est. average glucose Bld gHb Est-mCnc: 105 mg/dL
Hgb A1c MFr Bld: 5.3 % (ref 4.8–5.6)

## 2024-06-02 ENCOUNTER — Ambulatory Visit: Payer: Self-pay | Admitting: Family Medicine

## 2024-06-03 ENCOUNTER — Ambulatory Visit (INDEPENDENT_AMBULATORY_CARE_PROVIDER_SITE_OTHER): Admitting: Family Medicine

## 2024-06-03 ENCOUNTER — Encounter: Payer: Self-pay | Admitting: Family Medicine

## 2024-06-03 VITALS — BP 127/85 | HR 91 | Ht 60.0 in | Wt 231.6 lb

## 2024-06-03 DIAGNOSIS — Z0000 Encounter for general adult medical examination without abnormal findings: Secondary | ICD-10-CM

## 2024-06-03 DIAGNOSIS — K4190 Unilateral femoral hernia, without obstruction or gangrene, not specified as recurrent: Secondary | ICD-10-CM | POA: Insufficient documentation

## 2024-06-03 DIAGNOSIS — F330 Major depressive disorder, recurrent, mild: Secondary | ICD-10-CM

## 2024-06-03 NOTE — Progress Notes (Signed)
 Complete physical exam   Patient: Amber Whitehead   DOB: 15-Mar-1981   43 y.o. Female  MRN: [id number] Visit Date: 06/03/2024  Today's healthcare provider: Jon Eva, MD   Chief Complaint  Patient presents with   Annual Exam    Last completed 05/29/23 Diet -  well balanced Exercise - none lately Feeling - well Sleeping - well Concerns - leg pain around the area she has femoral hernia X few months that is intermittent   Care Management    Mammo completed with [facility] Colonoscopy would like information   Subjective    Amber Whitehead is a 43 y.o. female who presents today for a complete physical exam.   Discussed the use of AI scribe software for clinical note transcription with the patient, who gave verbal consent to proceed.  History of Present Illness   Amber Whitehead is a 43 year old female who presents for an annual physical exam and evaluation of intermittent groin pain due to a known femoral hernia.  She has intermittent shooting pain in the left groin at the site of her known femoral hernia. Pain occurs after sitting for a while and then standing, varies in intensity, and can be absent at times. It was present earlier today and resolved by afternoon.  The femoral hernia was identified about three years ago when she was scheduled for surgical repair, which was canceled due to pregnancy. It caused minimal symptoms until recently. A 2022 CT showed a roughly 10 cm hernia with possible fluid collection. She could palpate the hernia before pregnancy but not since.  She takes Zoloft  and Wellbutrin prescribed for mood, which she finds effective. Recent blood work at this facility last Wednesday was normal.  She has not seen a dentist recently and plans to schedule a dental appointment for herself.      The 10-year ASCVD risk score (Arnett DK, et al., 2019) is: 1.1%   Last depression screening scores    06/03/2024    3:54 PM 05/29/2023    11:07 AM 05/02/2022    8:50 AM  PHQ 2/9 Scores  PHQ - 2 Score 1 2 2   PHQ- 9 Score 4 6  6       Data saved with a previous flowsheet row definition   Last fall risk screening    06/03/2024    3:54 PM  Fall Risk   Falls in the past year? 0  Number falls in past yr: 0  Injury with Fall? 0  Risk for fall due to : No Fall Risks  Follow up Falls evaluation completed        Medications: Outpatient Medications Prior to Visit  Medication Sig   buPROPion (WELLBUTRIN XL) 150 MG 24 hr tablet Take 1 tablet by mouth daily. By Nathanel Bunker   sertraline  (ZOLOFT ) 100 MG tablet Zoloft  100 mg tablet  Take 1 tablet every day by oral route.   No facility-administered medications prior to visit.    Review of Systems    Objective    BP 127/85 (BP Location: Left Arm, Patient Position: Sitting, Cuff Size: Large)   Pulse 91   Ht 5' (1.524 m)   Wt 231 lb 9.6 oz (105.1 kg)   LMP 06/02/2024   SpO2 97%   Breastfeeding No   BMI 45.23 kg/m    Physical Exam Vitals reviewed.  Constitutional:      General: She is not in acute distress.    Appearance: Normal appearance.  She is well-developed. She is not diaphoretic.  HENT:     Head: Normocephalic and atraumatic.     Right Ear: Tympanic membrane, ear canal and external ear normal.     Left Ear: Tympanic membrane, ear canal and external ear normal.     Nose: Nose normal.     Mouth/Throat:     Mouth: Mucous membranes are moist.     Pharynx: Oropharynx is clear. No oropharyngeal exudate.  Eyes:     General: No scleral icterus.    Conjunctiva/sclera: Conjunctivae normal.     Pupils: Pupils are equal, round, and reactive to light.  Neck:     Thyroid: No thyromegaly.  Cardiovascular:     Rate and Rhythm: Normal rate and regular rhythm.     Heart sounds: Normal heart sounds. No murmur heard. Pulmonary:     Effort: Pulmonary effort is normal. No respiratory distress.     Breath sounds: Normal breath sounds. No wheezing or rales.   Abdominal:     General: There is no distension.     Palpations: Abdomen is soft.     Tenderness: There is no abdominal tenderness.  Musculoskeletal:        General: No deformity.     Cervical back: Neck supple.     Right lower leg: No edema.     Left lower leg: No edema.  Lymphadenopathy:     Cervical: No cervical adenopathy.  Skin:    General: Skin is warm and dry.     Findings: No rash.  Neurological:     Mental Status: She is alert and oriented to person, place, and time. Mental status is at baseline.     Gait: Gait normal.  Psychiatric:        Mood and Affect: Mood normal.        Behavior: Behavior normal.        Thought Content: Thought content normal.      No results found for any visits on 06/03/24.  Assessment & Plan    Routine Health Maintenance and Physical Exam  Exercise Activities and Dietary recommendations  Goals   None     Immunization History  Administered Date(s) Administered   DTaP 06/25/1979, 08/27/1979, 10/29/1979, 11/13/1980, 12/10/1984   Dtap, Unspecified 06/25/1979, 08/27/1979, 10/29/1979, 11/13/1980, 12/10/1984   Hep A / Hep B 04/08/2002   Hep B, Unspecified 02/03/1998, 07/02/1998   Hepatitis A 04/08/2002   Hepatitis B 07/02/1998, 04/08/2002   IPV 06/25/1979, 08/27/1979, 10/29/1979, 11/13/1980   Influenza Inj Mdck Quad Pf 04/01/2018   Influenza, Mdck, Trivalent,PF 6+ MOS(egg free) 05/03/2024   Influenza, Seasonal, Injecte, Preservative Fre 03/27/2023   Influenza,inj,Quad PF,6+ Mos 03/13/2019, 04/06/2020, 04/28/2021, 05/02/2022   MMR 08/12/1980, 05/21/1991, 12/03/2021   Meningococcal Conjugate 11/11/1985   PFIZER(Purple Top)SARS-COV-2 Vaccination 09/04/2019, 10/02/2019, 04/15/2020   Pfizer(Comirnaty)Fall Seasonal Vaccine 12 years and older 03/27/2023, 05/03/2024   Polio, Unspecified 06/25/1979, 08/27/1979, 10/29/1979, 11/13/1980, 11/11/1985   Td 07/16/1997   Tdap 03/13/2019, 09/28/2021    Health Maintenance  Topic Date Due   HPV  VACCINES (1 - 3-dose SCDM series) Never done   Mammogram  09/22/2021   Colonoscopy  Never done   Cervical Cancer Screening (HPV/Pap Cotest)  05/25/2026   DTaP/Tdap/Td (9 - Td or Tdap) 09/29/2031   Influenza Vaccine  Completed   COVID-19 Vaccine  Completed   Hepatitis C Screening  Completed   HIV Screening  Completed   Pneumococcal Vaccine  Aged Out   Meningococcal B Vaccine  Aged Out  Hepatitis B Vaccines 19-59 Average Risk  Discontinued    Discussed health benefits of physical activity, and encouraged her to engage in regular exercise appropriate for her age and condition.  Problem List Items Addressed This Visit       Other   MDD (major depressive disorder)   Mood is well-managed with current medications, Zoloft  and Wellbutrin, prescribed by GYN. - Continue current medications as prescribed by GYN       Morbid obesity (HCC)   Discussed importance of healthy weight management Discussed diet and exercise       Non-recurrent unilateral femoral hernia without obstruction or gangrene   Intermittent shooting pain in the groin area associated with a previously diagnosed femoral hernia. Hernia was not repaired due to pregnancy. Symptoms are intermittent and not currently severe. Discussed potential for hernia to become more uncomfortable or trapped, necessitating surgical intervention. Robotic hernia repair offers better repair rates and less failure rates. - Monitor hernia symptoms and report if pain becomes severe or persistent - Avoid prolonged sitting and take breaks to reduce pressure on the hernia - Will consider referral to surgeon if symptoms worsen      Other Visit Diagnoses       Encounter for annual physical exam    -  Primary           Woman's Wellness Visit Routine wellness visit for a 43 year old female. Discussed colon cancer screening options, including colonoscopy and Cologuard. Colonoscopy is the gold standard, offering a 10-year interval if normal, while  Cologuard is less sensitive but offers a 3-year interval if negative. Discussed the importance of avoiding constipation and taking breaks from prolonged sitting to manage hernia symptoms. - Provided information on colon cancer screening options - Will track down mammogram results from [facility] - up-to-date on Pap smear - Confirm flu shot received this year       Return in about 1 year (around 06/03/2025) for CPE.     Jon Eva, MD  [facility] [phone] (phone) [phone] (fax)  [facility]

## 2024-06-03 NOTE — Assessment & Plan Note (Signed)
 Mood is well-managed with current medications, Zoloft  and Wellbutrin, prescribed by GYN. - Continue current medications as prescribed by GYN

## 2024-06-03 NOTE — Assessment & Plan Note (Signed)
 Intermittent shooting pain in the groin area associated with a previously diagnosed femoral hernia. Hernia was not repaired due to pregnancy. Symptoms are intermittent and not currently severe. Discussed potential for hernia to become more uncomfortable or trapped, necessitating surgical intervention. Robotic hernia repair offers better repair rates and less failure rates. - Monitor hernia symptoms and report if pain becomes severe or persistent - Avoid prolonged sitting and take breaks to reduce pressure on the hernia - Will consider referral to surgeon if symptoms worsen

## 2024-06-03 NOTE — Assessment & Plan Note (Signed)
 Discussed importance of healthy weight management Discussed diet and exercise

## 2024-06-20 ENCOUNTER — Ambulatory Visit: Admitting: Family Medicine

## 2024-06-20 VITALS — BP 124/64 | HR 84 | Ht 60.0 in | Wt 235.2 lb

## 2024-06-20 DIAGNOSIS — J0110 Acute frontal sinusitis, unspecified: Secondary | ICD-10-CM | POA: Diagnosis not present

## 2024-06-20 MED ORDER — AMOXICILLIN 500 MG PO CAPS: 1000.0000 mg | ORAL_CAPSULE | Freq: Three times a day (TID) | ORAL | 0 refills | Status: AC

## 2024-06-20 NOTE — Progress Notes (Signed)
" °  ° ° °  Established patient visit   Patient: Amber Whitehead   DOB: May 10, 1981   43 y.o. Female  MRN: [id number] Visit Date: 06/20/2024  Today's healthcare provider: Nancyann Perry, MD   Chief Complaint  Patient presents with   Acute Visit    Pt states she might have a sinus infection, started 1x week ago. Pt states she has not had a fever at home. OTC mucinex   Subjective    Discussed the use of AI scribe software for clinical note transcription with the patient, who gave verbal consent to proceed.  History of Present Illness   Amber Whitehead is a 43 year old female who presents with sinus congestion and pressure for the past week and a half.  She has been experiencing sinus congestion and pressure primarily on the right side of her face, extending from her forehead down into her jaw. She reports pressure and difficulty breathing through her nose.  She has been using Mucinex to manage her symptoms but has not taken any other cough or cold medications or decongestants. Her nasal discharge is clear with some yellow tint. No high fever, with temperatures not reaching 101F or higher.  She experiences occasional burning sensations in her ears but no significant ear pain. She has a cough due to mucus drainage into her throat, but her throat is not consistently sore. No trouble breathing through her lungs.  No history of allergies or asthma.      Medications: Show/hide medication list[1]      Objective    BP 124/64 (BP Location: Left Arm, Patient Position: Sitting, Cuff Size: Large)   Pulse 84   Ht 5' (1.524 m)   Wt 235 lb 3.2 oz (106.7 kg)   LMP 06/02/2024   SpO2 99%   BMI 45.93 kg/m   Physical Exam   General Appearance:    Obese female, alert, cooperative, in no acute distress  HENT:   right, bilateral TM normal without fluid or infection, neck without nodes, right frontal and maxillary sinuses tender, and post nasal drip noted  Eyes:    PERRL,  conjunctiva/corneas clear, EOM's intact       Lungs:     Clear to auscultation bilaterally, respirations unlabored  Heart:    Normal heart rate. Normal rhythm. No murmurs, rubs, or gallops.    Neurologic:   Awake, alert, oriented x 3. No apparent focal neurological           defect.        Assessment & Plan    1. Acute non-recurrent frontal sinusitis (Primary)  - amoxicillin  (AMOXIL ) 500 MG capsule; Take 2 capsules (1,000 mg total) by mouth 3 (three) times daily for 5 days.  Dispense: 30 capsule; Refill: 0  Recommend OTC nasal saline rinses.       Nancyann Perry, MD  [facility] [phone] (phone) [phone] (fax)  [facility]    [1]  Outpatient Medications Prior to Visit  Medication Sig   buPROPion (WELLBUTRIN XL) 150 MG 24 hr tablet Take 1 tablet by mouth daily. By Nathanel Bunker   sertraline  (ZOLOFT ) 100 MG tablet Zoloft  100 mg tablet  Take 1 tablet every day by oral route.   No facility-administered medications prior to visit.   "

## 2025-06-08 ENCOUNTER — Encounter: Admitting: Family Medicine
